# Patient Record
Sex: Male | Born: 1965 | Race: White | Hispanic: No | Marital: Single
Health system: Southern US, Community
[De-identification: ages and names within clinical notes are randomized; demographics above are authoritative.]

---

## 2005-03-15 ENCOUNTER — Emergency Department (HOSPITAL_COMMUNITY): Admission: EM | Admit: 2005-03-15 | Discharge: 2005-03-15 | Payer: Self-pay | Admitting: Family Medicine

## 2005-05-06 ENCOUNTER — Emergency Department (HOSPITAL_COMMUNITY): Admission: EM | Admit: 2005-05-06 | Discharge: 2005-05-06 | Payer: Self-pay | Admitting: Family Medicine

## 2005-05-27 ENCOUNTER — Encounter: Admission: RE | Admit: 2005-05-27 | Discharge: 2005-06-21 | Payer: Self-pay | Admitting: Orthopedic Surgery

## 2010-05-22 ENCOUNTER — Emergency Department (HOSPITAL_COMMUNITY): Admission: EM | Admit: 2010-05-22 | Discharge: 2010-05-22 | Payer: Self-pay | Admitting: Emergency Medicine

## 2012-01-27 IMAGING — CR DG FOOT COMPLETE 3+V*R*
3 series · 3 of 3 positions shown · non-contrast
Comparison: Bruising and swelling.  Right aspect of the first
metatarsal.

CLINICAL DATA: Right foot injury

RIGHT FOOT COMPLETE - 3+ VIEW

[t foot ap right]
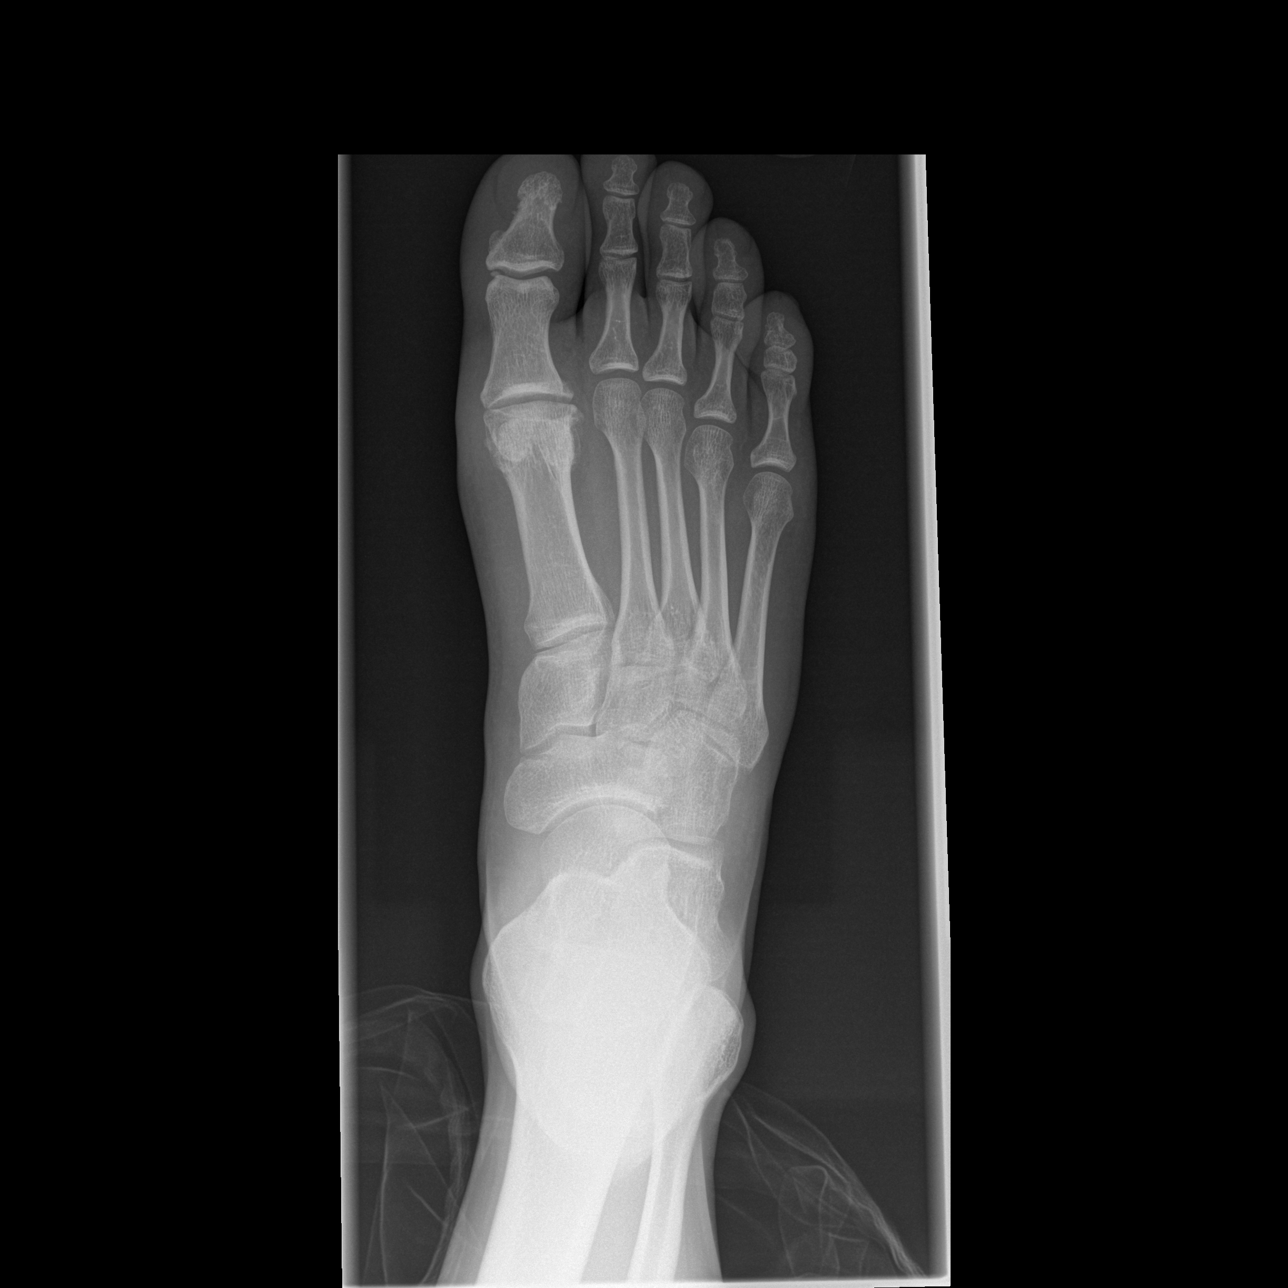

[t foot oblique right]
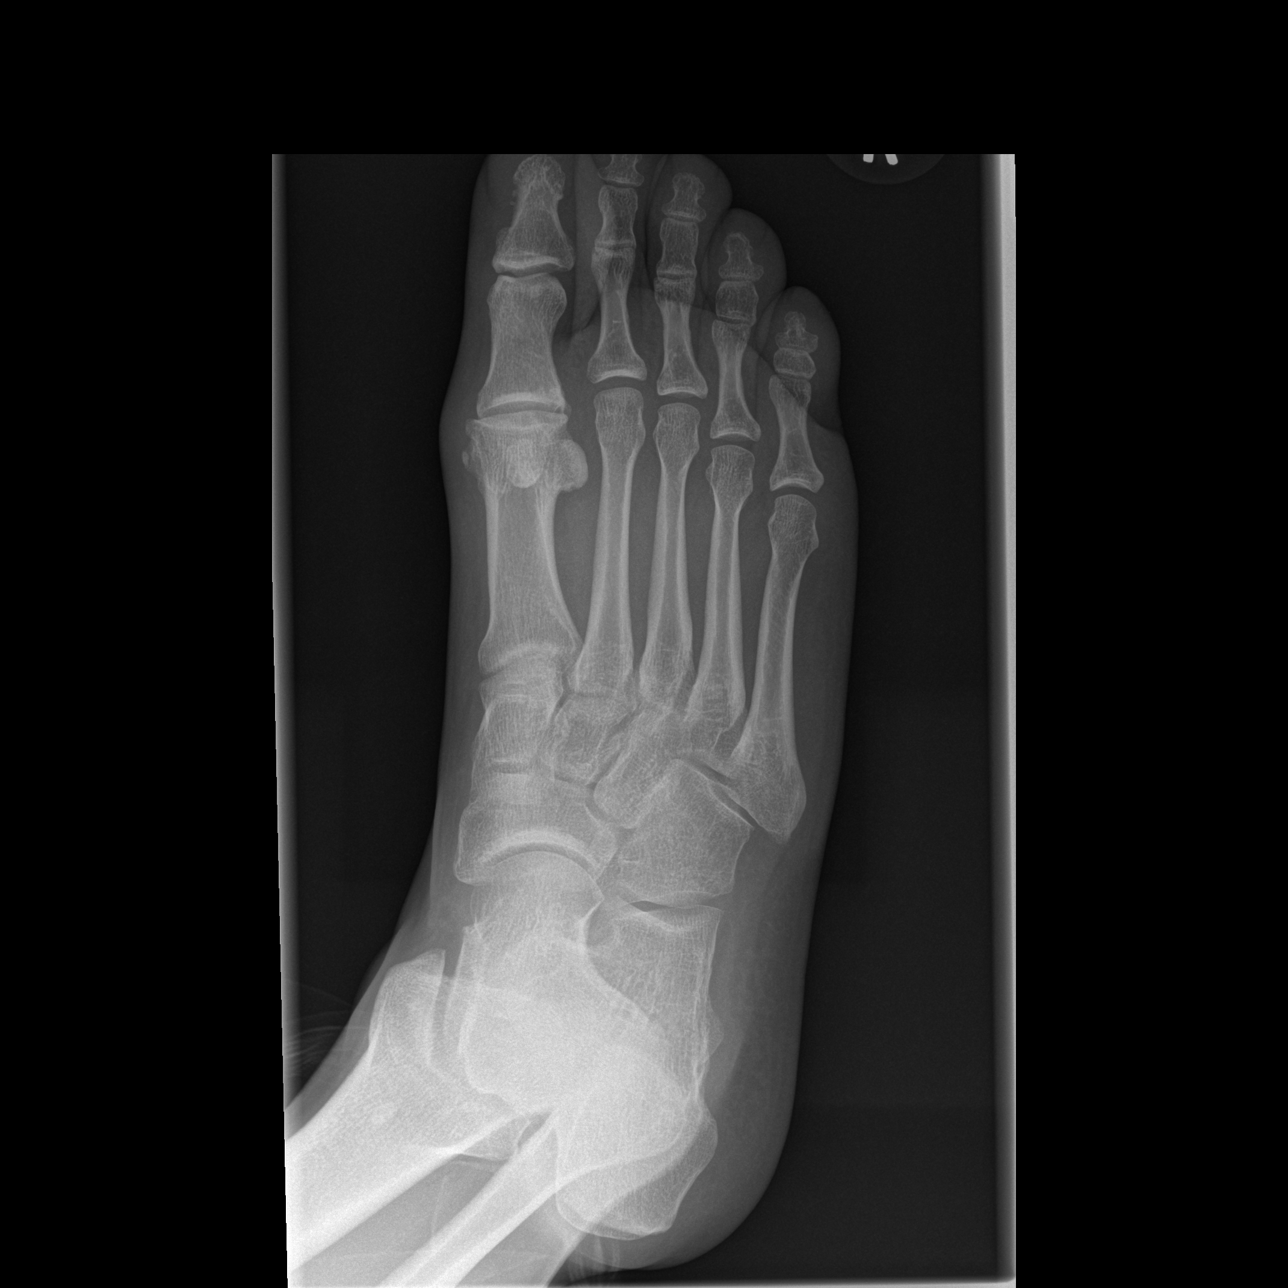

[t foot lat right]
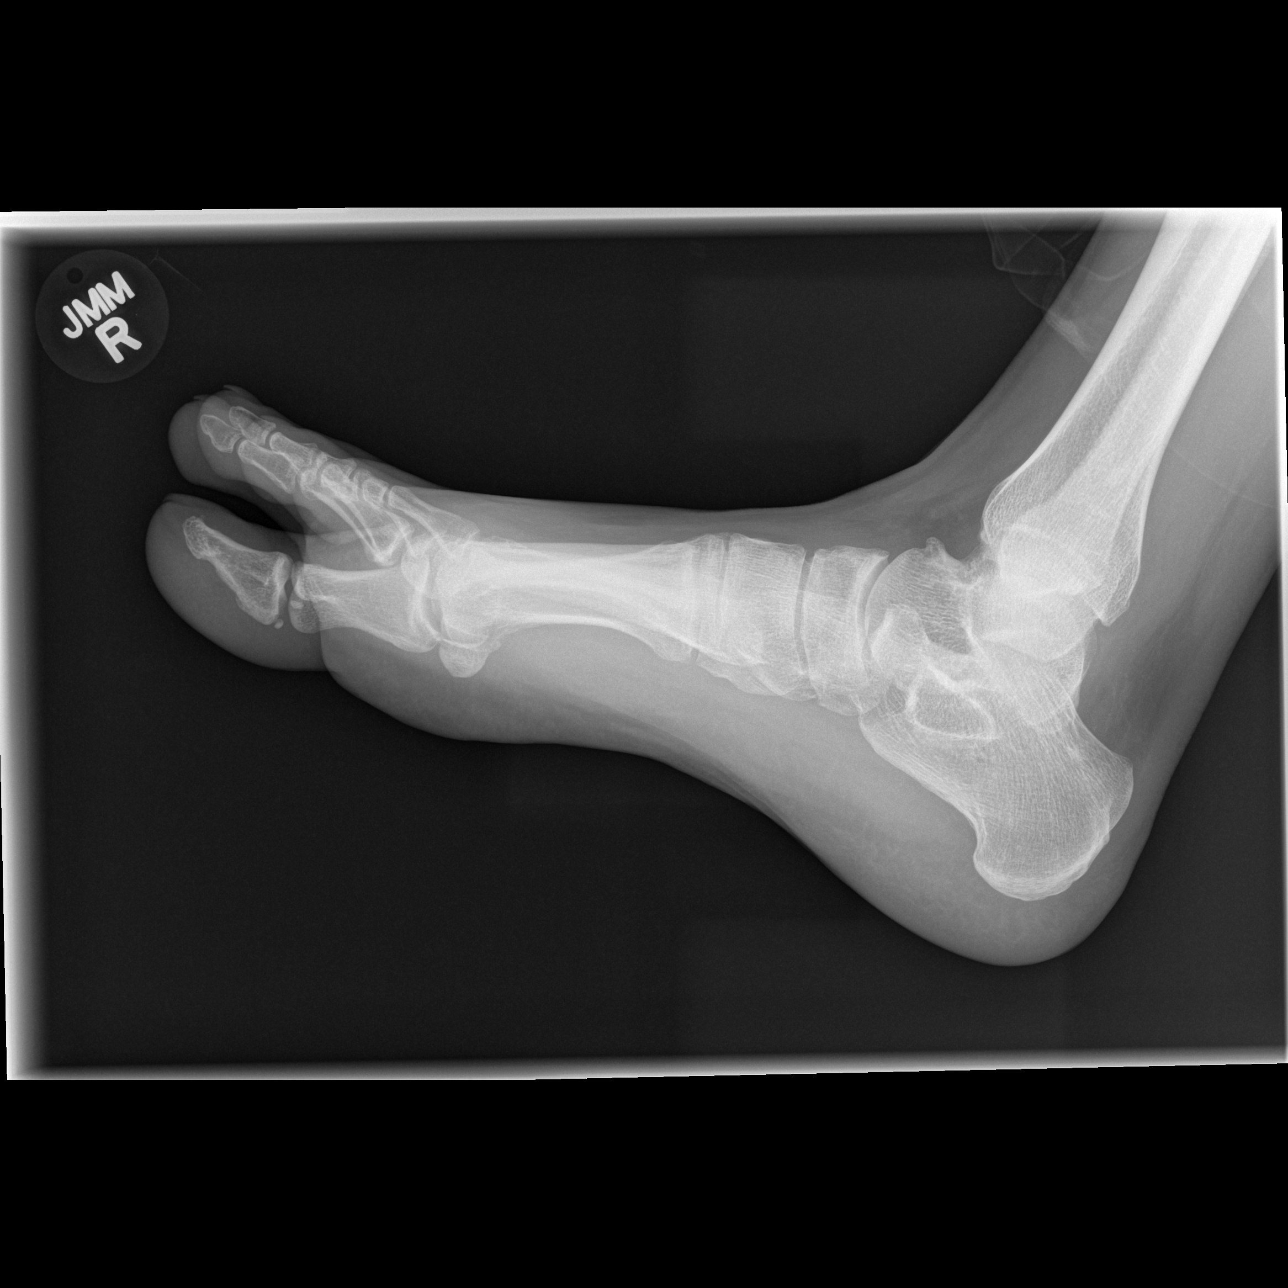

[3 of 3 positions shown; findings below may reference images not displayed]

FINDINGS: No clear evidence of fracture first metatarsal.  There is
significant bony shadows from the sesamoid bone in which could
obscure a nondisplaced fracture.  No additional evidence of
fracture dislocation of the foot.
IMPRESSION: No clear evidence of fracture the first metatarsal although the of
the head metatarsal is obscured by multiple sesamoid bones.  This
could mask underlying nondisplaced fracture.

## 2019-04-04 ENCOUNTER — Other Ambulatory Visit: Payer: Self-pay

## 2019-04-04 DIAGNOSIS — Z20822 Contact with and (suspected) exposure to covid-19: Secondary | ICD-10-CM

## 2019-04-06 LAB — NOVEL CORONAVIRUS, NAA: SARS-CoV-2, NAA: NOT DETECTED

## 2022-01-31 ENCOUNTER — Other Ambulatory Visit (HOSPITAL_COMMUNITY)
Admission: EM | Admit: 2022-01-31 | Discharge: 2022-02-04 | Disposition: A | Discharge status: Home / Self Care | Payer: Commercial Managed Care - PPO | Attending: Psychiatry | Admitting: Psychiatry

## 2022-01-31 DIAGNOSIS — F17210 Nicotine dependence, cigarettes, uncomplicated: Secondary | ICD-10-CM | POA: Insufficient documentation

## 2022-01-31 DIAGNOSIS — F411 Generalized anxiety disorder: Secondary | ICD-10-CM | POA: Insufficient documentation

## 2022-01-31 DIAGNOSIS — Z951 Presence of aortocoronary bypass graft: Secondary | ICD-10-CM | POA: Diagnosis not present

## 2022-01-31 DIAGNOSIS — I10 Essential (primary) hypertension: Secondary | ICD-10-CM | POA: Insufficient documentation

## 2022-01-31 DIAGNOSIS — F1010 Alcohol abuse, uncomplicated: Secondary | ICD-10-CM | POA: Diagnosis present

## 2022-01-31 DIAGNOSIS — F3289 Other specified depressive episodes: Secondary | ICD-10-CM

## 2022-01-31 DIAGNOSIS — Z79899 Other long term (current) drug therapy: Secondary | ICD-10-CM | POA: Diagnosis not present

## 2022-01-31 DIAGNOSIS — F39 Unspecified mood [affective] disorder: Secondary | ICD-10-CM

## 2022-01-31 DIAGNOSIS — Z7982 Long term (current) use of aspirin: Secondary | ICD-10-CM | POA: Insufficient documentation

## 2022-01-31 DIAGNOSIS — I2510 Atherosclerotic heart disease of native coronary artery without angina pectoris: Secondary | ICD-10-CM | POA: Insufficient documentation

## 2022-01-31 DIAGNOSIS — F319 Bipolar disorder, unspecified: Secondary | ICD-10-CM | POA: Diagnosis not present

## 2022-01-31 DIAGNOSIS — Z9151 Personal history of suicidal behavior: Secondary | ICD-10-CM | POA: Diagnosis not present

## 2022-01-31 DIAGNOSIS — Z20822 Contact with and (suspected) exposure to covid-19: Secondary | ICD-10-CM | POA: Diagnosis not present

## 2022-01-31 LAB — CBC WITH DIFFERENTIAL/PLATELET
Abs Immature Granulocytes: 0.02 10*3/uL (ref 0.00–0.07)
Basophils Absolute: 0.1 10*3/uL (ref 0.0–0.1)
Basophils Relative: 1 %
Eosinophils Absolute: 0 10*3/uL (ref 0.0–0.5)
Eosinophils Relative: 1 %
HCT: 44.2 % (ref 39.0–52.0)
Hemoglobin: 14.9 g/dL (ref 13.0–17.0)
Immature Granulocytes: 0 %
Lymphocytes Relative: 47 %
Lymphs Abs: 3.7 10*3/uL (ref 0.7–4.0)
MCH: 30.8 pg (ref 26.0–34.0)
MCHC: 33.7 g/dL (ref 30.0–36.0)
MCV: 91.3 fL (ref 80.0–100.0)
Monocytes Absolute: 0.7 10*3/uL (ref 0.1–1.0)
Monocytes Relative: 9 %
Neutro Abs: 3.3 10*3/uL (ref 1.7–7.7)
Neutrophils Relative %: 42 %
Platelets: 237 10*3/uL (ref 150–400)
RBC: 4.84 MIL/uL (ref 4.22–5.81)
RDW: 12.9 % (ref 11.5–15.5)
WBC: 7.8 10*3/uL (ref 4.0–10.5)
nRBC: 0 % (ref 0.0–0.2)

## 2022-01-31 LAB — LIPID PANEL
Cholesterol: 190 mg/dL (ref 0–200)
HDL: 111 mg/dL (ref >40)
LDL Cholesterol: 49 mg/dL (ref 0–99)
Total CHOL/HDL Ratio: 1.7 RATIO
Triglycerides: 150 mg/dL — ABNORMAL HIGH (ref <150)
VLDL: 30 mg/dL (ref 0–40)

## 2022-01-31 LAB — COMPREHENSIVE METABOLIC PANEL
ALT: 30 U/L (ref 0–44)
AST: 40 U/L (ref 15–41)
Albumin: 4.8 g/dL (ref 3.5–5.0)
Alkaline Phosphatase: 67 U/L (ref 38–126)
Anion gap: 13 (ref 5–15)
BUN: <5 mg/dL — ABNORMAL LOW (ref 6–20)
CO2: 25 mmol/L (ref 22–32)
Calcium: 9.4 mg/dL (ref 8.9–10.3)
Chloride: 99 mmol/L (ref 98–111)
Creatinine, Ser: 0.78 mg/dL (ref 0.61–1.24)
GFR, Estimated: >60 mL/min (ref >60)
Glucose, Bld: 90 mg/dL (ref 70–99)
Potassium: 4.3 mmol/L (ref 3.5–5.1)
Sodium: 137 mmol/L (ref 135–145)
Total Bilirubin: 0.4 mg/dL (ref 0.3–1.2)
Total Protein: 8.1 g/dL (ref 6.5–8.1)

## 2022-01-31 LAB — POCT URINE DRUG SCREEN - MANUAL ENTRY (I-SCREEN)
POC Amphetamine UR: NOT DETECTED
POC Buprenorphine (BUP): NOT DETECTED
POC Cocaine UR: NOT DETECTED
POC Marijuana UR: POSITIVE — AB
POC Methadone UR: NOT DETECTED
POC Methamphetamine UR: NOT DETECTED
POC Morphine: NOT DETECTED
POC Oxazepam (BZO): NOT DETECTED
POC Oxycodone UR: NOT DETECTED
POC Secobarbital (BAR): NOT DETECTED

## 2022-01-31 LAB — RESP PANEL BY RT-PCR (FLU A&B, COVID) ARPGX2
Influenza A by PCR: NEGATIVE
Influenza B by PCR: NEGATIVE
SARS Coronavirus 2 by RT PCR: NEGATIVE

## 2022-01-31 LAB — MAGNESIUM: Magnesium: 2.4 mg/dL (ref 1.7–2.4)

## 2022-01-31 LAB — TSH: TSH: 1.92 u[IU]/mL (ref 0.350–4.500)

## 2022-01-31 LAB — ETHANOL: Alcohol, Ethyl (B): 243 mg/dL — ABNORMAL HIGH (ref <10)

## 2022-01-31 LAB — POC SARS CORONAVIRUS 2 AG: SARSCOV2ONAVIRUS 2 AG: NEGATIVE

## 2022-01-31 MED ORDER — LORAZEPAM 1 MG PO TABS
1.0000 mg | ORAL_TABLET | Freq: Three times a day (TID) | ORAL | Status: AC
  Administered 2022-02-01 – 2022-02-02 (×3): 1 mg via ORAL
  Filled 2022-01-31 (×3): qty 1

## 2022-01-31 MED ORDER — ADULT MULTIVITAMIN W/MINERALS CH
1.0000 | ORAL_TABLET | Freq: Every day | ORAL | Status: DC
  Administered 2022-01-31 – 2022-02-04 (×5): 1 via ORAL
  Filled 2022-01-31 (×5): qty 1

## 2022-01-31 MED ORDER — NAPROXEN 500 MG PO TABS: 500.0000 mg | ORAL_TABLET | Freq: Two times a day (BID) | ORAL | Status: DC | PRN

## 2022-01-31 MED ORDER — CARVEDILOL 3.125 MG PO TABS
6.2500 mg | ORAL_TABLET | Freq: Two times a day (BID) | ORAL | Status: DC
  Administered 2022-01-31 – 2022-02-04 (×8): 6.25 mg via ORAL
  Filled 2022-01-31 (×8): qty 2

## 2022-01-31 MED ORDER — LOPERAMIDE HCL 2 MG PO CAPS: 2.0000 mg | ORAL_CAPSULE | ORAL | Status: AC | PRN

## 2022-01-31 MED ORDER — ONDANSETRON 4 MG PO TBDP: 4.0000 mg | ORAL_TABLET | Freq: Four times a day (QID) | ORAL | Status: AC | PRN

## 2022-01-31 MED ORDER — LORAZEPAM 1 MG PO TABS
1.0000 mg | ORAL_TABLET | Freq: Four times a day (QID) | ORAL | Status: AC | PRN
  Administered 2022-02-02: 1 mg via ORAL

## 2022-01-31 MED ORDER — DICYCLOMINE HCL 20 MG PO TABS: 20.0000 mg | ORAL_TABLET | Freq: Four times a day (QID) | ORAL | Status: DC | PRN

## 2022-01-31 MED ORDER — THIAMINE HCL 100 MG PO TABS
100.0000 mg | ORAL_TABLET | Freq: Every day | ORAL | Status: DC
  Administered 2022-02-01 – 2022-02-04 (×4): 100 mg via ORAL
  Filled 2022-01-31 (×4): qty 1

## 2022-01-31 MED ORDER — LORAZEPAM 1 MG PO TABS
1.0000 mg | ORAL_TABLET | Freq: Every day | ORAL | Status: AC
  Administered 2022-02-04: 1 mg via ORAL
  Filled 2022-01-31: qty 1

## 2022-01-31 MED ORDER — LORAZEPAM 1 MG PO TABS
1.0000 mg | ORAL_TABLET | Freq: Two times a day (BID) | ORAL | Status: AC
  Administered 2022-02-03: 1 mg via ORAL
  Filled 2022-01-31 (×2): qty 1

## 2022-01-31 MED ORDER — HYDROXYZINE HCL 25 MG PO TABS
25.0000 mg | ORAL_TABLET | Freq: Four times a day (QID) | ORAL | Status: AC | PRN
  Administered 2022-02-01 – 2022-02-03 (×4): 25 mg via ORAL
  Filled 2022-01-31: qty 1
  Filled 2022-01-31: qty 15
  Filled 2022-01-31 (×3): qty 1
  Filled 2022-01-31: qty 15

## 2022-01-31 MED ORDER — LISINOPRIL 10 MG PO TABS
10.0000 mg | ORAL_TABLET | Freq: Once | ORAL | Status: AC
  Administered 2022-02-01: 10 mg via ORAL
  Filled 2022-01-31: qty 1

## 2022-01-31 MED ORDER — METHOCARBAMOL 500 MG PO TABS: 500.0000 mg | ORAL_TABLET | Freq: Three times a day (TID) | ORAL | Status: DC | PRN

## 2022-01-31 MED ORDER — THIAMINE HCL 100 MG/ML IJ SOLN
100.0000 mg | Freq: Once | INTRAMUSCULAR | Status: AC
  Administered 2022-01-31: 100 mg via INTRAMUSCULAR
  Filled 2022-01-31: qty 2

## 2022-01-31 MED ORDER — MAGNESIUM HYDROXIDE 400 MG/5ML PO SUSP: 30.0000 mL | Freq: Every day | ORAL | Status: DC | PRN

## 2022-01-31 MED ORDER — LORAZEPAM 1 MG PO TABS
1.0000 mg | ORAL_TABLET | Freq: Four times a day (QID) | ORAL | Status: AC
  Administered 2022-01-31 – 2022-02-01 (×4): 1 mg via ORAL
  Filled 2022-01-31 (×4): qty 1

## 2022-01-31 MED ORDER — TRAZODONE HCL 50 MG PO TABS
50.0000 mg | ORAL_TABLET | Freq: Every evening | ORAL | Status: DC | PRN
  Administered 2022-01-31 – 2022-02-03 (×4): 50 mg via ORAL
  Filled 2022-01-31 (×4): qty 1

## 2022-01-31 MED ORDER — ACETAMINOPHEN 325 MG PO TABS: 650.0000 mg | ORAL_TABLET | Freq: Four times a day (QID) | ORAL | Status: DC | PRN

## 2022-01-31 MED ORDER — ALUM & MAG HYDROXIDE-SIMETH 200-200-20 MG/5ML PO SUSP: 30.0000 mL | ORAL | Status: DC | PRN

## 2022-01-31 MED ORDER — ATORVASTATIN CALCIUM 40 MG PO TABS
40.0000 mg | ORAL_TABLET | Freq: Every day | ORAL | Status: DC
  Administered 2022-02-01 – 2022-02-04 (×4): 40 mg via ORAL
  Filled 2022-01-31 (×4): qty 1

## 2022-01-31 NOTE — ED Notes (Addendum)
Pt coming to [facility].
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
## 2022-02-01 ENCOUNTER — Other Ambulatory Visit: Payer: Self-pay

## 2022-02-01 MED ORDER — LORATADINE 10 MG PO TABS: 10.0000 mg | ORAL_TABLET | Freq: Every day | ORAL | Status: DC | PRN

## 2022-02-01 NOTE — Progress Notes (Signed)
Patient slept all morning and was experiencing mild etoh withdrawal with a CIWA of 5.  Patient got up and ate lunch with peer whom he knows from the community.  Patient is tolerating ativan taper.  No somatic complaints and denies avh shi or plan at present.  Will monitor and provide safe environment.

## 2022-02-01 NOTE — ED Notes (Signed)
Patient refused group said he wanted to sleep, explained group was important I will give him a booklette on Stress Management with strategies and ways to adopt a healthy lifesyle  along with work sheets with ideas to manage your stress.

## 2022-02-01 NOTE — ED Notes (Signed)
Pt is in the bed sleeping. Respirations are even and unlabored. No acute distress noted. Will continue to monitor for safety. 

## 2022-02-01 NOTE — ED Notes (Signed)
Pt provided lunch.  Currently interacting with others on the unit.  No pain or discomfort noted/ voiced.  Breathing is even and unlabored.

## 2022-02-02 ENCOUNTER — Encounter (HOSPITAL_COMMUNITY): Payer: Self-pay

## 2022-02-02 LAB — HEMOGLOBIN A1C
Hgb A1c MFr Bld: 5.9 % — ABNORMAL HIGH (ref 4.8–5.6)
Mean Plasma Glucose: 123 mg/dL

## 2022-02-02 MED ORDER — NICOTINE 21 MG/24HR TD PT24
21.0000 mg | MEDICATED_PATCH | Freq: Every day | TRANSDERMAL | Status: DC
  Administered 2022-02-02 – 2022-02-03 (×2): 21 mg via TRANSDERMAL
  Filled 2022-02-02 (×2): qty 1

## 2022-02-02 MED ORDER — NICOTINE 21 MG/24HR TD PT24
MEDICATED_PATCH | TRANSDERMAL | Status: AC
  Filled 2022-02-02: qty 1

## 2022-02-02 MED ORDER — DM-GUAIFENESIN ER 30-600 MG PO TB12
1.0000 | ORAL_TABLET | Freq: Two times a day (BID) | ORAL | Status: DC
  Administered 2022-02-02 – 2022-02-04 (×4): 1 via ORAL
  Filled 2022-02-02 (×4): qty 1

## 2022-02-02 NOTE — ED Notes (Signed)
Pt is in the bed sleeping. Respirations are even and unlabored. No acute distress noted. Will continue to monitor for safety. 

## 2022-02-02 NOTE — BH IP Treatment Plan (Signed)
Interdisciplinary Treatment and Diagnostic Plan Update  02/02/2022 Time of Session: 10:30AM  Paul Weaver MRN: [id number]  Diagnosis:  Final diagnoses:  Alcohol abuse  Anxiety state  Other depression     Current Medications:  Current Facility-Administered Medications  Medication Dose Route Frequency Provider Last Rate Last Admin   acetaminophen (TYLENOL) tablet 650 mg  650 mg Oral Q6H PRN Sindy Guadeloupe, NP       alum & mag hydroxide-simeth (MAALOX/MYLANTA) 200-200-20 MG/5ML suspension 30 mL  30 mL Oral Q4H PRN Sindy Guadeloupe, NP       atorvastatin (LIPITOR) tablet 40 mg  40 mg Oral Daily Sindy Guadeloupe, NP   40 mg at 02/02/22 0837   carvedilol (COREG) tablet 6.25 mg  6.25 mg Oral BID WC Sindy Guadeloupe, NP   6.25 mg at 02/02/22 9629   dextromethorphan-guaiFENesin (MUCINEX DM) 30-600 MG per 12 hr tablet 1 tablet  1 tablet Oral BID Estella Husk, MD   1 tablet at 02/02/22 1332   hydrOXYzine (ATARAX) tablet 25 mg  25 mg Oral Q6H PRN Sindy Guadeloupe, NP   25 mg at 02/02/22 1524   loperamide (IMODIUM) capsule 2-4 mg  2-4 mg Oral PRN Sindy Guadeloupe, NP       loratadine (CLARITIN) tablet 10 mg  10 mg Oral Daily PRN Estella Husk, MD       LORazepam (ATIVAN) tablet 1 mg  1 mg Oral Q6H PRN Sindy Guadeloupe, NP       LORazepam (ATIVAN) tablet 1 mg  1 mg Oral BID Sindy Guadeloupe, NP       Followed by   Melene Muller ON 02/04/2022] LORazepam (ATIVAN) tablet 1 mg  1 mg Oral Daily Sindy Guadeloupe, NP       magnesium hydroxide (MILK OF MAGNESIA) suspension 30 mL  30 mL Oral Daily PRN Sindy Guadeloupe, NP       multivitamin with minerals tablet 1 tablet  1 tablet Oral Daily Sindy Guadeloupe, NP   1 tablet at 02/02/22 0837   naproxen (NAPROSYN) tablet 500 mg  500 mg Oral BID PRN Sindy Guadeloupe, NP       nicotine (NICODERM CQ - dosed in mg/24 hours) patch 21 mg  21 mg Transdermal Daily Estella Husk, MD   21 mg at 02/02/22 1018   ondansetron (ZOFRAN-ODT) disintegrating tablet 4 mg  4 mg Oral Q6H PRN  Sindy Guadeloupe, NP       thiamine tablet 100 mg  100 mg Oral Daily Sindy Guadeloupe, NP   100 mg at 02/02/22 0837   traZODone (DESYREL) tablet 50 mg  50 mg Oral QHS PRN Sindy Guadeloupe, NP   50 mg at 02/01/22 2252   No current outpatient medications on file.   PTA Medications: Prior to Admission medications   Not on File    Patient Stressors: Substance abuse    Patient Strengths: Motivation for treatment/growth   Treatment Modalities: Medication Management, Group therapy, Case management,  1 to 1 session with clinician, Psychoeducation, Recreational therapy.   Physician Treatment Plan for Primary and Secondary Diagnosis:  Final diagnoses:  Alcohol abuse  Anxiety state  Other depression   Long Term Goal(s): Improvement in symptoms so as ready for discharge  Short Term Goals: Patient will verbalize feelings in meetings with treatment team members. Patient will attend at least of 50% of the groups daily. Pt will complete the PHQ9 on admission, day 3 and discharge. Patient will participate in completing the [place] Suicide Severity Rating Scale Patient will  score a low risk of violence for 24 hours prior to discharge  Medication Management: Evaluate patient's response, side effects, and tolerance of medication regimen.  Therapeutic Interventions: 1 to 1 sessions, Unit Group sessions and Medication administration.  Evaluation of Outcomes: Progressing  LCSW Treatment Plan for Primary Diagnosis:  Final diagnoses:  Alcohol abuse  Anxiety state  Other depression    Long Term Goal(s): Safe transition to appropriate next level of care at discharge.  Short Term Goals: Facilitate acceptance of mental health diagnosis and concerns through verbal commitment to aftercare plan and appointments at discharge. and Identify minimum of 2 triggers associated with mental health/substance abuse issues with treatment team members.  Therapeutic Interventions: Assess for all discharge needs, 1 to 1  time with Child psychotherapist, Explore available resources and support systems, Assess for adequacy in community support network, Educate family and significant other(s) on suicide prevention, Complete Psychosocial Assessment, Interpersonal group therapy.  Evaluation of Outcomes: Progressing   Progress in Treatment: Attending groups: No. Participating in groups: No. Taking medication as prescribed: Yes. Toleration medication: Yes. Family/Significant other contact made: No, will contact:  no one at this time Patient understands diagnosis: Yes. Discussing patient identified problems/goals with staff: Yes. Medical problems stabilized or resolved: Yes. Denies suicidal/homicidal ideation: Yes. Issues/concerns per patient self-inventory: No. Other: None   New problem(s) identified: None   New Short Term/Long Term Goal(s): Participate in AA meetings; Engage in outpatient substance abuse services   Patient Goals: "I just need to stop drinking completely"     Discharge Plan or Barriers: Rishith plans to return home   Reason for Continuation of Hospitalization: Medication stabilization  Estimated Length of Stay: 3-5 days  Last 3 [place] Suicide Severity Risk Score: Flowsheet Row ED from 01/31/2022 in [facility]  C-SSRS RISK CATEGORY No Risk       Last [facility] 2/9 Scores:    01/31/2022    9:50 PM  Depression screen PHQ 2/9  Decreased Interest 2  Down, Depressed, Hopeless 3  PHQ - 2 Score 5  Altered sleeping 2  Tired, decreased energy 2  Change in appetite 3  Feeling bad or failure about yourself  1  Trouble concentrating 1  Moving slowly or fidgety/restless 1  Suicidal thoughts 1  PHQ-9 Score 16  Difficult doing work/chores Very difficult    Scribe for Treatment Team: Maeola Sarah, LCSW 02/02/2022 3:53 PM

## 2022-02-02 NOTE — Progress Notes (Signed)
Patient ate lunch and watched tv for a short period of time.  He is presently sitting in dayroom.  No complaints or distress.  Denies avh shi or plan.  No withdrawal symptoms and tolerating detox protocol.  Patient is pleasant, organized and logical.  Makes needs known appropriately.  Will monitor and provide a safe supportive environment.

## 2022-02-02 NOTE — ED Notes (Signed)
Patient is calm and cooperative. Will be attending the AA meeting when they arrive. Patient denies SI and AVH. Will continue to monitor for safety.

## 2022-02-02 NOTE — ED Notes (Signed)
Patient asleep in bed without issue or complaint.  No evidence of withdrawal.  Will continue to provide safe supportive environment.

## 2022-02-02 NOTE — ED Notes (Signed)
Pt. Is attending AA. ?

## 2022-02-02 NOTE — ED Provider Notes (Signed)
Behavioral Health Progress Note  Date and Time: 02/02/2022 2:08 PM Name: Paul Weaver MRN:  [id number]  Subjective:   Paul Weaver is a 56 year old male with a reported psychiatric history of depression, bipolar disorder, alcohol use disorder, alcohol use disorder, HTN, CAD with h/o CABG who presented to the [facility] behavioral health urgent care on 01/31/2022 seeking detox from alcohol-reported consuming 12 beers a day during the week since mid 2021 and consuming up to 24 beers on the weekend.  Patient was admitted to the [facility] for detox and crisis stabilization.  EtOH 243; UDS + marijuana.  Patient seen and chart reviewed. Most recent CIWA 0. VSS.interviewed this morning in his room in conjunction with LCSW this morning.  Patient describes his mood as "I feel weird".  When asked for clarification, patient reports that he is feels "weak" and has been experiencing chills, low energy and tremors.  Tremors apparent during interview while drinking water. He denies nausea, vomiting, GI upset, headaches.  Patient states that he was able to eat breakfast this morning without issue.  Patient reports sleep and describes trazodone possibly causing vivid dreams.  Discussed with patient that this is a reported SE with this medication-verbalized understanding.  Patient reports that he is experiencing nicotine cravings and requests a nicotine patch.  Patient denies SI/HI/AVH.  Patient reports that he is still interested in detox only and plans to discharge to self-care once detox is complete.  Patient remains agreeable to remain in the [facility] until detox is completed on Friday, 02/04/2022. Patient was given the opportunity to ask questions and  All questions answered. Patient verbalized understanding regarding plan of care.      Diagnosis:  Final diagnoses:  Alcohol abuse  Anxiety state  Other depression    Total Time spent with patient: 20 minutes  Past Psychiatric History: depression, AUD,bipolar  dosrder Past Medical History: No past medical history on file.  Family History: No family history on file. Family Psychiatric  History: father with alcohol use Social History:  Social History   Substance and Sexual Activity  Alcohol Use Not on file     Social History   Substance and Sexual Activity  Drug Use Not on file    Social History   Socioeconomic History   Marital status: Single    Spouse name: Not on file   Number of children: Not on file   Years of education: Not on file   Highest education level: Not on file  Occupational History   Not on file  Tobacco Use   Smoking status: Not on file   Smokeless tobacco: Not on file  Substance and Sexual Activity   Alcohol use: Not on file   Drug use: Not on file   Sexual activity: Not on file  Other Topics Concern   Not on file  Social History Narrative   Not on file   Social Determinants of Health   Financial Resource Strain: Not on file  Food Insecurity: Not on file  Transportation Needs: Not on file  Physical Activity: Not on file  Stress: Not on file  Social Connections: Not on file   SDOH:  SDOH Screenings   Alcohol Screen: Not on file  Depression (PHQ2-9): Medium Risk (01/31/2022)   Depression (PHQ2-9)    PHQ-2 Score: 16  Financial Resource Strain: Not on file  Food Insecurity: Not on file  Housing: Not on file  Physical Activity: Not on file  Social Connections: Not on file  Stress: Not on  file  Tobacco Use: Not on file  Transportation Needs: Not on file   Additional Social History:    Pain Medications: See MAR Prescriptions: See MAR Over the Counter: See MAR History of alcohol / drug use?: Yes Longest period of sobriety (when/how long): 3 years from 2020 through 1/2 of 2021 (per pt) Negative Consequences of Use: Personal relationships Withdrawal Symptoms: Blackouts, Change in blood pressure, Agitation Name of Substance 1: EtOH 1 - Age of First Use: Unknown 1 - Amount (size/oz): 7 12-ounce  beers 1 - Frequency: Daily 1 - Duration: Ongoing 1 - Last Use / Amount: 01/31/22 1 - Method of Aquiring: Purchase 1- Route of Use: Oral Name of Substance 2: Marijuana 2 - Age of First Use: Unknown 2 - Amount (size/oz): 1 joint 2 - Frequency: Used daily for a week 2 - Duration: Ongoing 2 - Last Use / Amount: Today; several hits off of the joint. 2 - Method of Aquiring: Unknown 2 - Route of Substance Use: Smoke                Sleep:  recently poor but states that he slept well last night  Appetite:   decreased; attributes to nausea  Current Medications:  Current Facility-Administered Medications  Medication Dose Route Frequency Provider Last Rate Last Admin   acetaminophen (TYLENOL) tablet 650 mg  650 mg Oral Q6H PRN Sindy Guadeloupe, NP       alum & mag hydroxide-simeth (MAALOX/MYLANTA) 200-200-20 MG/5ML suspension 30 mL  30 mL Oral Q4H PRN Sindy Guadeloupe, NP       atorvastatin (LIPITOR) tablet 40 mg  40 mg Oral Daily Sindy Guadeloupe, NP   40 mg at 02/02/22 0837   carvedilol (COREG) tablet 6.25 mg  6.25 mg Oral BID WC Sindy Guadeloupe, NP   6.25 mg at 02/02/22 1610   dextromethorphan-guaiFENesin (MUCINEX DM) 30-600 MG per 12 hr tablet 1 tablet  1 tablet Oral BID Estella Husk, MD   1 tablet at 02/02/22 1332   dicyclomine (BENTYL) tablet 20 mg  20 mg Oral Q6H PRN Sindy Guadeloupe, NP       hydrOXYzine (ATARAX) tablet 25 mg  25 mg Oral Q6H PRN Sindy Guadeloupe, NP   25 mg at 02/01/22 2252   loperamide (IMODIUM) capsule 2-4 mg  2-4 mg Oral PRN Sindy Guadeloupe, NP       loratadine (CLARITIN) tablet 10 mg  10 mg Oral Daily PRN Estella Husk, MD       LORazepam (ATIVAN) tablet 1 mg  1 mg Oral Q6H PRN Sindy Guadeloupe, NP       LORazepam (ATIVAN) tablet 1 mg  1 mg Oral TID Sindy Guadeloupe, NP   1 mg at 02/02/22 9604   Followed by   LORazepam (ATIVAN) tablet 1 mg  1 mg Oral BID Sindy Guadeloupe, NP       Followed by   Melene Muller ON 02/04/2022] LORazepam (ATIVAN) tablet 1 mg  1 mg Oral Daily  Sindy Guadeloupe, NP       magnesium hydroxide (MILK OF MAGNESIA) suspension 30 mL  30 mL Oral Daily PRN Sindy Guadeloupe, NP       methocarbamol (ROBAXIN) tablet 500 mg  500 mg Oral Q8H PRN Sindy Guadeloupe, NP       multivitamin with minerals tablet 1 tablet  1 tablet Oral Daily Sindy Guadeloupe, NP   1 tablet at 02/02/22 0837   naproxen (NAPROSYN) tablet 500 mg  500 mg Oral BID PRN Sindy Guadeloupe, NP  nicotine (NICODERM CQ - dosed in mg/24 hours) patch 21 mg  21 mg Transdermal Daily Estella HuskLaubach, Yashas Camilli S, MD   21 mg at 02/02/22 1018   ondansetron (ZOFRAN-ODT) disintegrating tablet 4 mg  4 mg Oral Q6H PRN Sindy GuadeloupeWilliams, Roy, NP       thiamine tablet 100 mg  100 mg Oral Daily Sindy GuadeloupeWilliams, Roy, NP   100 mg at 02/02/22 0837   traZODone (DESYREL) tablet 50 mg  50 mg Oral QHS PRN Sindy GuadeloupeWilliams, Roy, NP   50 mg at 02/01/22 2252   No current outpatient medications on file.    Labs  Lab Results:  Admission on 01/31/2022  Component Date Value Ref Range Status   SARS Coronavirus 2 by RT PCR 01/31/2022 NEGATIVE  NEGATIVE Final   Comment: (NOTE) SARS-CoV-2 target nucleic acids are NOT DETECTED.  The SARS-CoV-2 RNA is generally detectable in upper respiratory specimens during the acute phase of infection. The lowest concentration of SARS-CoV-2 viral copies this assay can detect is 138 copies/mL. A negative result does not preclude SARS-Cov-2 infection and should not be used as the sole basis for treatment or other patient management decisions. A negative result may occur with  improper specimen collection/handling, submission of specimen other than nasopharyngeal swab, presence of viral mutation(s) within the areas targeted by this assay, and inadequate number of viral copies(<138 copies/mL). A negative result must be combined with clinical observations, patient history, and epidemiological information. The expected result is Negative.  Fact Sheet for Patients:  BloggerCourse.comhttps://www.fda.gov/media/152166/download  Fact  Sheet for Healthcare Providers:  SeriousBroker.ithttps://www.fda.gov/media/152162/download  This test is no                          t yet approved or cleared by the [place] FDA and  has been authorized for detection and/or diagnosis of SARS-CoV-2 by FDA under an Emergency Use Authorization (EUA). This EUA will remain  in effect (meaning this test can be used) for the duration of the COVID-19 declaration under Section 564(b)(1) of the Act, 21 U.S.C.section 360bbb-3(b)(1), unless the authorization is terminated  or revoked sooner.       Influenza A by PCR 01/31/2022 NEGATIVE  NEGATIVE Final   Influenza B by PCR 01/31/2022 NEGATIVE  NEGATIVE Final   Comment: (NOTE) The Xpert Xpress SARS-CoV-2/FLU/RSV plus assay is intended as an aid in the diagnosis of influenza from Nasopharyngeal swab specimens and should not be used as a sole basis for treatment. Nasal washings and aspirates are unacceptable for Xpert Xpress SARS-CoV-2/FLU/RSV testing.  Fact Sheet for Patients: BloggerCourse.comhttps://www.fda.gov/media/152166/download  Fact Sheet for Healthcare Providers: SeriousBroker.ithttps://www.fda.gov/media/152162/download  This test is not yet approved or cleared by the [place] FDA and has been authorized for detection and/or diagnosis of SARS-CoV-2 by FDA under an Emergency Use Authorization (EUA). This EUA will remain in effect (meaning this test can be used) for the duration of the COVID-19 declaration under Section 564(b)(1) of the Act, 21 U.S.C. section 360bbb-3(b)(1), unless the authorization is terminated or revoked.  Performed at [facility], [address] [place], [place] [id number]    WBC 01/31/2022 7.8  4.0 - 10.5 K/uL Final   RBC 01/31/2022 4.84  4.22 - 5.81 MIL/uL Final   Hemoglobin 01/31/2022 14.9  13.0 - 17.0 g/dL Final   HCT 44/03/474206/26/2023 44.2  39.0 - 52.0 % Final   MCV 01/31/2022 91.3  80.0 - 100.0 fL Final   MCH 01/31/2022 30.8  26.0 - 34.0 pg Final  MCHC 01/31/2022 33.7  30.0 - 36.0 g/dL  Final   RDW 72/53/6644 12.9  11.5 - 15.5 % Final   Platelets 01/31/2022 237  150 - 400 K/uL Final   nRBC 01/31/2022 0.0  0.0 - 0.2 % Final   Neutrophils Relative % 01/31/2022 42  % Final   Neutro Abs 01/31/2022 3.3  1.7 - 7.7 K/uL Final   Lymphocytes Relative 01/31/2022 47  % Final   Lymphs Abs 01/31/2022 3.7  0.7 - 4.0 K/uL Final   Monocytes Relative 01/31/2022 9  % Final   Monocytes Absolute 01/31/2022 0.7  0.1 - 1.0 K/uL Final   Eosinophils Relative 01/31/2022 1  % Final   Eosinophils Absolute 01/31/2022 0.0  0.0 - 0.5 K/uL Final   Basophils Relative 01/31/2022 1  % Final   Basophils Absolute 01/31/2022 0.1  0.0 - 0.1 K/uL Final   Immature Granulocytes 01/31/2022 0  % Final   Abs Immature Granulocytes 01/31/2022 0.02  0.00 - 0.07 K/uL Final   Performed at [facility], [address] [place], [place] [address]   Sodium 01/31/2022 137  135 - 145 mmol/L Final   Potassium 01/31/2022 4.3  3.5 - 5.1 mmol/L Final   Chloride 01/31/2022 99  98 - 111 mmol/L Final   CO2 01/31/2022 25  22 - 32 mmol/L Final   Glucose, Bld 01/31/2022 90  70 - 99 mg/dL Final   Glucose reference range applies only to samples taken after fasting for at least 8 hours.   BUN 01/31/2022 <5 (L)  6 - 20 mg/dL Final   Creatinine, Ser 01/31/2022 0.78  0.61 - 1.24 mg/dL Final   Calcium 25/95/6387 9.4  8.9 - 10.3 mg/dL Final   Total Protein 56/43/3295 8.1  6.5 - 8.1 g/dL Final   Albumin 18/84/1660 4.8  3.5 - 5.0 g/dL Final   AST 63/08/6008 40  15 - 41 U/L Final   ALT 01/31/2022 30  0 - 44 U/L Final   Alkaline Phosphatase 01/31/2022 67  38 - 126 U/L Final   Total Bilirubin 01/31/2022 0.4  0.3 - 1.2 mg/dL Final   GFR, Estimated 01/31/2022 >60  >60 mL/min Final   Comment: (NOTE) Calculated using the CKD-EPI Creatinine Equation (2021)    Anion gap 01/31/2022 13  5 - 15 Final   Performed at [facility], [address] [place], [place] [address]   Hgb A1c MFr Bld 01/31/2022 5.9 (H)  4.8 - 5.6 % Final    Comment: (NOTE)         Prediabetes: 5.7 - 6.4         Diabetes: >6.4         Glycemic control for adults with diabetes: <7.0    Mean Plasma Glucose 01/31/2022 123  mg/dL Final   Comment: (NOTE) Performed At: [facility] [address] [place], [place] [id number] Jolene Schimke MD YH:[id number]    Magnesium 01/31/2022 2.4  1.7 - 2.4 mg/dL Final   Performed at [facility], [address], [place], [place] [address]   Alcohol, Ethyl (B) 01/31/2022 243 (H)  <10 mg/dL Final   Comment: (NOTE) Lowest detectable limit for serum alcohol is 10 mg/dL.  For medical purposes only. Performed at [facility], [address] [place], [place] [address]    Cholesterol 01/31/2022 190  0 - 200 mg/dL Final   Triglycerides 10/62/6948 150 (H)  <150 mg/dL Final   HDL 54/62/7035 111  >40 mg/dL Final  Total CHOL/HDL Ratio 01/31/2022 1.7  RATIO Final   VLDL 01/31/2022 30  0 - 40 mg/dL Final   LDL Cholesterol 01/31/2022 49  0 - 99 mg/dL Final   Comment:        Total Cholesterol/HDL:CHD Risk Coronary Heart Disease Risk Table                     Men   Women  1/2 Average Risk   3.4   3.3  Average Risk       5.0   4.4  2 X Average Risk   9.6   7.1  3 X Average Risk  23.4   11.0        Use the calculated Patient Ratio above and the CHD Risk Table to determine the patient's CHD Risk.        ATP III CLASSIFICATION (LDL):  <100     mg/dL   Optimal  119-147  mg/dL   Near or Above                    Optimal  130-159  mg/dL   Borderline  829-562  mg/dL   High  >130     mg/dL   Very High Performed at [facility], [address], [place], [place] [address]    TSH 01/31/2022 1.920  0.350 - 4.500 uIU/mL Final   Comment: Performed by a 3rd Generation assay with a functional sensitivity of <=0.01 uIU/mL. Performed at [facility], [address], [place], [place] [address]    POC Amphetamine UR 01/31/2022 None Detected  NONE DETECTED (Cut Off Level 1000 ng/mL)  Preliminary   POC Secobarbital (BAR) 01/31/2022 None Detected  NONE DETECTED (Cut Off Level 300 ng/mL) Preliminary   POC Buprenorphine (BUP) 01/31/2022 None Detected  NONE DETECTED (Cut Off Level 10 ng/mL) Preliminary   POC Oxazepam (BZO) 01/31/2022 None Detected  NONE DETECTED (Cut Off Level 300 ng/mL) Preliminary   POC Cocaine UR 01/31/2022 None Detected  NONE DETECTED (Cut Off Level 300 ng/mL) Preliminary   POC Methamphetamine UR 01/31/2022 None Detected  NONE DETECTED (Cut Off Level 1000 ng/mL) Preliminary   POC Morphine 01/31/2022 None Detected  NONE DETECTED (Cut Off Level 300 ng/mL) Preliminary   POC Methadone UR 01/31/2022 None Detected  NONE DETECTED (Cut Off Level 300 ng/mL) Preliminary   POC Oxycodone UR 01/31/2022 None Detected  NONE DETECTED (Cut Off Level 100 ng/mL) Preliminary   POC Marijuana UR 01/31/2022 Positive (A)  NONE DETECTED (Cut Off Level 50 ng/mL) Preliminary   SARSCOV2ONAVIRUS 2 AG 01/31/2022 NEGATIVE  NEGATIVE Final   Comment: (NOTE) SARS-CoV-2 antigen NOT DETECTED.   Negative results are presumptive.  Negative results do not preclude SARS-CoV-2 infection and should not be used as the sole basis for treatment or other patient management decisions, including infection  control decisions, particularly in the presence of clinical signs and  symptoms consistent with COVID-19, or in those who have been in contact with the virus.  Negative results must be combined with clinical observations, patient history, and epidemiological information. The expected result is Negative.  Fact Sheet for Patients: https://www.jennings-kim.com/  Fact Sheet for Healthcare Providers: https://alexander-rogers.biz/  This test is not yet approved or cleared by the [place] FDA and  has been authorized for detection and/or diagnosis of SARS-CoV-2 by FDA under an Emergency Use Authorization (EUA).  This EUA will remain in effect (meaning this test can be  used) for the duration  of  the COV                          ID-19 declaration under Section 564(b)(1) of the Act, 21 U.S.C. section 360bbb-3(b)(1), unless the authorization is terminated or revoked sooner.      Blood Alcohol level:  Lab Results  Component Value Date   ETH 243 (H) 01/31/2022    Metabolic Disorder Labs: Lab Results  Component Value Date   HGBA1C 5.9 (H) 01/31/2022   MPG 123 01/31/2022   No results found for: "PROLACTIN" Lab Results  Component Value Date   CHOL 190 01/31/2022   TRIG 150 (H) 01/31/2022   HDL 111 01/31/2022   CHOLHDL 1.7 01/31/2022   VLDL 30 01/31/2022   LDLCALC 49 01/31/2022    Therapeutic Lab Levels: No results found for: "LITHIUM" No results found for: "VALPROATE" No results found for: "CBMZ"  Physical Findings   PHQ2-9    Flowsheet Row ED from 01/31/2022 in [facility]  PHQ-2 Total Score 5  PHQ-9 Total Score 16      Flowsheet Row ED [facility]31/2022 in [facility]  C-SSRS RISK CATEGORY No Risk        Musculoskeletal  Strength & Muscle Tone: within normal limits Gait & Station: normal Patient leans: N/A  Psychiatric Specialty Exam  Presentation  General Appearance: Appropriate for Environment; Casual  Eye Contact:Fair  Speech:Clear and Coherent; Normal Rate  Speech Volume:Normal  Handedness:Ambidextrous   Mood and Affect  Mood:-- ("I feel weird")  Affect:Appropriate; Congruent   Thought Process  Thought Processes:Coherent; Goal Directed; Linear  Descriptions of Associations:Intact  Orientation:Full (Time, Place and Person)  Thought Content:WDL; Logical  Diagnosis of Schizophrenia or Schizoaffective disorder in past: No    Hallucinations:Hallucinations: None  Ideas of Reference:None  Suicidal Thoughts:Suicidal Thoughts: No  Homicidal Thoughts:Homicidal Thoughts: No   Sensorium  Memory:Immediate Good; Recent Good; Remote  Good  Judgment:Fair  Insight:Fair   Executive Functions  Concentration:Good  Attention Span:Good  Recall:Good  Fund of Knowledge:Good  Language:Good   Psychomotor Activity  Psychomotor Activity:Psychomotor Activity: Decreased   Assets  Assets:Communication Skills; Desire for Improvement   Sleep  Sleep:Sleep: Poor   No data recorded   Physical Exam  Physical Exam Constitutional:      Appearance: Normal appearance. He is normal weight.  HENT:     Head: Normocephalic and atraumatic.  Eyes:     Extraocular Movements: Extraocular movements intact.  Pulmonary:     Effort: Pulmonary effort is normal.  Neurological:     General: No focal deficit present.     Mental Status: He is alert and oriented to person, place, and time.     Comments: Mild tremor with BUE outstreched  Psychiatric:        Attention and Perception: Attention and perception normal.        Speech: Speech normal.        Behavior: Behavior normal. Behavior is cooperative.        Thought Content: Thought content normal.    Review of Systems  Constitutional:  Positive for chills, diaphoresis and malaise/fatigue. Negative for fever.  HENT:  Negative for hearing loss.   Eyes:  Negative for discharge and redness.  Respiratory:  Negative for cough.   Cardiovascular:  Negative for chest pain and palpitations.  Gastrointestinal:  Negative for abdominal pain, nausea and vomiting.  Musculoskeletal:  Negative for myalgias.  Neurological:  Positive for tremors. Negative  for headaches.  Psychiatric/Behavioral:  Positive for substance abuse. Negative for suicidal ideas. The patient has insomnia.    Blood pressure 113/77, pulse 94, temperature 98.3 F (36.8 C), temperature source Tympanic, resp. rate 18, SpO2 98 %. There is no height or weight on file to calculate BMI.  Treatment Plan Summary: Paul Weaver is a 56 year old male with a reported psychiatric history of depression, bipolar disorder, alcohol  use disorder, HTN, CAD with h/o CABG  who presented to the [facility] behavioral health urgent care on 01/31/2022 seeking detox from alcohol-reported consuming 12 beers a day during the week since mid 2021 and consuming up to 24 beers on the weekend.  Patient was started on ativan taper and  admitted to the [facility] for detox and crisis stabilization.  EtOH 243; UDS + marijuana.  Most recent CIWA 0.  Patient reporting alcohol withdrawal symptoms of tremors,insomnia, fatigue, sweating. VSS stable. Patient requesting nicotine replacement-is agreeable to nicotine patch.  Patient is in agreement to remain in the [facility] while completing taper in order to be monitored medically.  Patient made appropriate for continued admission to the [facility] for detox/crisis stabilization. Anciticipcate dc on Friday 6/30 after completion of taper  AUD, severe -continue CIWA protocol -continue ativan detox- 1 mg QID --> 1 mg TID--> 1 mg BID--> 1 mg daily -multivitamin -thiamine -Alcohol withdrawal PRNs  -zofran 4 mg q 6 hrs nausea  -loperamide 2-4 mg for diarrhea  -hydroxyzine 25 mg q6 hours for anxiety  Nicotine dependence -nicoderm cq patch 21 mg   HTN CAD Continue home medications -aspirin 81 mg daily  -atorvastatin 40 mg daily -lisinopril 10 mg daily -carvedilol 6.25 twice a day -Fish oil 100 mg daily.    Dispo: ongoing. LCSW assisting. Anticipate dc to self care on Friday after completion of taper w/ resources for outpatient substance use treatment  Estella Husk, MD 02/02/2022 2:08 PM

## 2022-02-02 NOTE — Clinical Social Work Psych Note (Signed)
LCSW Update Note  LCSW met with the [facility] for introduction and to begin discussions regarding treatment and potential discharge planning.   Candon shared that he felt "weird" this morning due to dreaming last night. Carey denied having any SI, HI or AVH at this time. He also endorsed having withdrawal symptoms including sweating, and a headache. Buryl reports having a "massive headache".   Per his initial assessment note, "Mariea Clonts expressed interest in participating in a residential treatment program following his detox protocol. Per Norman's request, LCSW referred the patient to [facility] for review. Per [facility] admissions, the patient is showing having [facility], which is exclusionary criteria. Mariea Clonts expressed disappointment, however he reported understanding".  "Pt states he drank 7 12-ounce beers today and that over the weekend he drank 31 12-ounce beers. smokes a small amount of marijuana on a daily basis; he states one joint typically lasts him a week".  Orie shared with LCSW that he wanted to complete his detox and discharge home with outpatient resources. He declined any residential treatment services at this time. Tomer reports he lives alone in [place], [place] and is currently employed.   Giavonni denied having any additional questions or concerns at this time.   LCSW will continue to follow.    Radonna Ricker, MSW, LCSW Clinical Education officer, museum ([place]) [facility]

## 2022-02-03 MED ORDER — HYDROXYZINE HCL 25 MG PO TABS: 25.0000 mg | ORAL_TABLET | Freq: Three times a day (TID) | ORAL | 0 refills | Status: AC | PRN

## 2022-02-03 MED ORDER — ATORVASTATIN CALCIUM 40 MG PO TABS: 40.0000 mg | ORAL_TABLET | Freq: Every day | ORAL | Status: AC

## 2022-02-03 MED ORDER — CARVEDILOL 6.25 MG PO TABS: 6.2500 mg | ORAL_TABLET | Freq: Two times a day (BID) | ORAL | Status: AC

## 2022-02-03 MED ORDER — BUSPIRONE HCL 5 MG PO TABS
5.0000 mg | ORAL_TABLET | Freq: Two times a day (BID) | ORAL | Status: DC
  Administered 2022-02-03 – 2022-02-04 (×3): 5 mg via ORAL
  Filled 2022-02-03 (×2): qty 14
  Filled 2022-02-03: qty 1
  Filled 2022-02-03: qty 14
  Filled 2022-02-03 (×2): qty 1

## 2022-02-03 MED ORDER — HYDROXYZINE HCL 25 MG PO TABS
25.0000 mg | ORAL_TABLET | Freq: Three times a day (TID) | ORAL | Status: DC | PRN
  Filled 2022-02-03: qty 15

## 2022-02-03 MED ORDER — NICOTINE 21 MG/24HR TD PT24: 21.0000 mg | MEDICATED_PATCH | Freq: Every day | TRANSDERMAL | Status: AC

## 2022-02-03 MED ORDER — BUSPIRONE HCL 5 MG PO TABS: 5.0000 mg | ORAL_TABLET | Freq: Two times a day (BID) | ORAL | 0 refills | Status: AC

## 2022-02-03 NOTE — ED Notes (Signed)
Patient is resting quietly with eyes closed. No S/S of distress, will continue to monitor for safety. 

## 2022-02-03 NOTE — ED Notes (Signed)
Pt is in the dinning room reading. Respirations are even and unlabored. No acute distress noted. Will continue to monitor for safety.

## 2022-02-03 NOTE — ED Notes (Signed)
Patient participating well in groups. Patient is cooperative and interacts well with staff.  Respiratory is even and unlabored. No distress noted.  will continue to monitor for safety.  

## 2022-02-03 NOTE — Discharge Instructions (Signed)

## 2022-02-03 NOTE — ED Notes (Signed)
Pt reported he is having difficulty sleeping. Pt requested for trazodone for sleep.

## 2022-02-03 NOTE — ED Notes (Signed)
Pt attended group and was given a handout

## 2022-02-03 NOTE — ED Notes (Signed)
Pt is in the bed awake. Respirations are even and unlabored. No acute distress noted. Will continue to monitor for safety. 

## 2022-02-03 NOTE — ED Notes (Signed)
Pt was given breakfast and lunch 

## 2022-02-03 NOTE — ED Notes (Signed)
Patient resting quietly with eyes closed, no S/S of distress. Respirations even and unlabored, will continue to monitor for safety.

## 2022-02-03 NOTE — Progress Notes (Signed)
Patient is alert and oriented X 4 with complaints of anxiety asking for Xanax this morning. RN informed patient no current order at this time will discuss with provider. Patient did receive ativan during medication administration. Patient does not appear to have withdrawal symptoms,no visible sweating, trembling or vomiting. Patients vitals statics within normal range. Patient denies SI and HI. No visible signs of distress. Nursing staff will continue to monitor.

## 2022-02-03 NOTE — ED Notes (Signed)
Pt was given dinner. 

## 2022-02-03 NOTE — ED Provider Notes (Signed)
Behavioral Health Progress Note  Date and Time: 02/03/2022 12:20 PM Name: Paul Weaver MRN:  [id number]  Subjective:   Ivis Henneman is a 56 year old male with a reported psychiatric history of depression, bipolar disorder, alcohol use disorder, alcohol use disorder, HTN, CAD with h/o CABG who presented to the [facility] behavioral health urgent care on 01/31/2022 seeking detox from alcohol-reported consuming 12 beers a day during the week since mid 2021 and consuming up to 24 beers on the weekend.  Patient was admitted to the [facility] for detox and crisis stabilization and started on ativan taper and CIWA protocol..  EtOH 243; UDS + marijuana.  Patient seen and chart reviewed. Most recent CIWA 3 (score of 3 for anxiety). VSS.  Prior to interview, staff informed me of patient requesting xanax. Prior to interview this  morning, patient was observed interacting with staff and peers appropriately. On interview, patient is calm, cooperative and pleasant. Patient describes his mood as anxious and requests medication to assist with this-patient specifically requests "a xanax to chill me out". Educated patient on the similar effects of benzodiazepines and alcohol in the body as well as similar addictive and dependence potential. Patient verbalized understanding although requested xanax because "it will keep me from drinking". Discussed with patient  that benzodiazepines assist with alcohol withdrawal but that there is no evidence to show that benzodiazepines help with alcohol cravings--discussed the possibility of starting naltrexone for alcohol cravings. Patient became defensive and stated that he does not crave alcohol and reported that he will attend AA meetings and meet with his sponsor upon discharge. Patient requests that ativan not be tapered off and that it be increased; educated patient on ativan taper for alcohol detox and that there is currently no indication to increase dose or frequency at this  time. Discussed with patient that PRN vistaril is available for anxiety as needed. Discussed option of starting buspar 5 mg BID as a daily medication for anxiety- after discussing r/b/se/ae patient is agreeable to trial. Patient denies nausea, vomiting, headache, tremors. He denies SI/HI/AVH. Patient reported ongoing poor sleep although reports some improvement over the last couple days.  Discussed that ativan taper ends tomorrow--patient verbalized understanding and ststes that his mother will pick him up tomorrow at 1030-11 am; he states that he plans to stay with her for several days after discharge and requests a letter for work.  Patient was given the opportunity to ask questions and  All questions answered. Patient verbalized understanding regarding plan of care.        Diagnosis:  Final diagnoses:  Alcohol abuse  Anxiety state  Other depression    Total Time spent with patient: 20 minutes  Past Psychiatric History: depression, AUD,bipolar disorder Past Medical History: No past medical history on file.  Family History: No family history on file. Family Psychiatric  History: father with alcohol use Social History:  Social History   Substance and Sexual Activity  Alcohol Use Not on file     Social History   Substance and Sexual Activity  Drug Use Not on file    Social History   Socioeconomic History   Marital status: Single    Spouse name: Not on file   Number of children: Not on file   Years of education: Not on file   Highest education level: Not on file  Occupational History   Not on file  Tobacco Use   Smoking status: Not on file   Smokeless tobacco: Not on file  Substance  and Sexual Activity   Alcohol use: Not on file   Drug use: Not on file   Sexual activity: Not on file  Other Topics Concern   Not on file  Social History Narrative   Not on file   Social Determinants of Health   Financial Resource Strain: Not on file  Food Insecurity: Not on file   Transportation Needs: Not on file  Physical Activity: Not on file  Stress: Not on file  Social Connections: Not on file   SDOH:  SDOH Screenings   Alcohol Screen: Not on file  Depression (PHQ2-9): Medium Risk (02/03/2022)   Depression (PHQ2-9)    PHQ-2 Score: 24  Financial Resource Strain: Not on file  Food Insecurity: Not on file  Housing: Not on file  Physical Activity: Not on file  Social Connections: Not on file  Stress: Not on file  Tobacco Use: Not on file  Transportation Needs: Not on file   Additional Social History:    Pain Medications: See MAR Prescriptions: See MAR Over the Counter: See MAR History of alcohol / drug use?: Yes Longest period of sobriety (when/how long): 3 years from 2020 through 1/2 of 2021 (per pt) Negative Consequences of Use: Personal relationships Withdrawal Symptoms: Blackouts, Change in blood pressure, Agitation Name of Substance 1: EtOH 1 - Age of First Use: Unknown 1 - Amount (size/oz): 7 12-ounce beers 1 - Frequency: Daily 1 - Duration: Ongoing 1 - Last Use / Amount: 01/31/22 1 - Method of Aquiring: Purchase 1- Route of Use: Oral Name of Substance 2: Marijuana 2 - Age of First Use: Unknown 2 - Amount (size/oz): 1 joint 2 - Frequency: Used daily for a week 2 - Duration: Ongoing 2 - Last Use / Amount: Today; several hits off of the joint. 2 - Method of Aquiring: Unknown 2 - Route of Substance Use: Smoke                Sleep:  recently poor but states that he slept well last night  Appetite:   decreased; attributes to nausea  Current Medications:  Current Facility-Administered Medications  Medication Dose Route Frequency Provider Last Rate Last Admin   acetaminophen (TYLENOL) tablet 650 mg  650 mg Oral Q6H PRN Sindy Guadeloupe, NP       alum & mag hydroxide-simeth (MAALOX/MYLANTA) 200-200-20 MG/5ML suspension 30 mL  30 mL Oral Q4H PRN Sindy Guadeloupe, NP       atorvastatin (LIPITOR) tablet 40 mg  40 mg Oral Daily Sindy Guadeloupe, NP   40 mg at 02/03/22 0831   busPIRone (BUSPAR) tablet 5 mg  5 mg Oral BID Estella Husk, MD   5 mg at 02/03/22 1145   carvedilol (COREG) tablet 6.25 mg  6.25 mg Oral BID WC Sindy Guadeloupe, NP   6.25 mg at 02/03/22 0831   dextromethorphan-guaiFENesin (MUCINEX DM) 30-600 MG per 12 hr tablet 1 tablet  1 tablet Oral BID Estella Husk, MD   1 tablet at 02/03/22 0831   hydrOXYzine (ATARAX) tablet 25 mg  25 mg Oral Q6H PRN Sindy Guadeloupe, NP   25 mg at 02/03/22 1150   loperamide (IMODIUM) capsule 2-4 mg  2-4 mg Oral PRN Sindy Guadeloupe, NP       loratadine (CLARITIN) tablet 10 mg  10 mg Oral Daily PRN Estella Husk, MD       LORazepam (ATIVAN) tablet 1 mg  1 mg Oral Q6H PRN Sindy Guadeloupe, NP   1 mg at 02/02/22  2112   [START ON 02/04/2022] LORazepam (ATIVAN) tablet 1 mg  1 mg Oral Daily Evette Georges, NP       magnesium hydroxide (MILK OF MAGNESIA) suspension 30 mL  30 mL Oral Daily PRN Evette Georges, NP       multivitamin with minerals tablet 1 tablet  1 tablet Oral Daily Evette Georges, NP   1 tablet at 02/03/22 0831   naproxen (NAPROSYN) tablet 500 mg  500 mg Oral BID PRN Evette Georges, NP       nicotine (NICODERM CQ - dosed in mg/24 hours) patch 21 mg  21 mg Transdermal Daily Ival Bible, MD   21 mg at 02/03/22 0830   nicotine (NICODERM CQ - dosed in mg/24 hours) patch 21 mg  21 mg Transdermal Daily Ival Bible, MD       ondansetron (ZOFRAN-ODT) disintegrating tablet 4 mg  4 mg Oral Q6H PRN Evette Georges, NP       thiamine tablet 100 mg  100 mg Oral Daily Evette Georges, NP   100 mg at 02/03/22 0831   traZODone (DESYREL) tablet 50 mg  50 mg Oral QHS PRN Evette Georges, NP   50 mg at 02/02/22 2113   No current outpatient medications on file.    Labs  Lab Results:  Admission on 01/31/2022  Component Date Value Ref Range Status   SARS Coronavirus 2 by RT PCR 01/31/2022 NEGATIVE  NEGATIVE Final   Comment: (NOTE) SARS-CoV-2 target nucleic acids are NOT  DETECTED.  The SARS-CoV-2 RNA is generally detectable in upper respiratory specimens during the acute phase of infection. The lowest concentration of SARS-CoV-2 viral copies this assay can detect is 138 copies/mL. A negative result does not preclude SARS-Cov-2 infection and should not be used as the sole basis for treatment or other patient management decisions. A negative result may occur with  improper specimen collection/handling, submission of specimen other than nasopharyngeal swab, presence of viral mutation(s) within the areas targeted by this assay, and inadequate number of viral copies(<138 copies/mL). A negative result must be combined with clinical observations, patient history, and epidemiological information. The expected result is Negative.  Fact Sheet for Patients:  EntrepreneurPulse.com.au  Fact Sheet for Healthcare Providers:  IncredibleEmployment.be  This test is no                          t yet approved or cleared by the [place] FDA and  has been authorized for detection and/or diagnosis of SARS-CoV-2 by FDA under an Emergency Use Authorization (EUA). This EUA will remain  in effect (meaning this test can be used) for the duration of the COVID-19 declaration under Section 564(b)(1) of the Act, 21 U.S.C.section 360bbb-3(b)(1), unless the authorization is terminated  or revoked sooner.       Influenza A by PCR 01/31/2022 NEGATIVE  NEGATIVE Final   Influenza B by PCR 01/31/2022 NEGATIVE  NEGATIVE Final   Comment: (NOTE) The Xpert Xpress SARS-CoV-2/FLU/RSV plus assay is intended as an aid in the diagnosis of influenza from Nasopharyngeal swab specimens and should not be used as a sole basis for treatment. Nasal washings and aspirates are unacceptable for Xpert Xpress SARS-CoV-2/FLU/RSV testing.  Fact Sheet for Patients: EntrepreneurPulse.com.au  Fact Sheet for Healthcare  Providers: IncredibleEmployment.be  This test is not yet approved or cleared by the [place] FDA and has been authorized for detection and/or diagnosis of SARS-CoV-2 by FDA under an Emergency Use Authorization (EUA). This  EUA will remain in effect (meaning this test can be used) for the duration of the COVID-19 declaration under Section 564(b)(1) of the Act, 21 U.S.C. section 360bbb-3(b)(1), unless the authorization is terminated or revoked.  Performed at [facility], [place] [address], [place], [place] [address]    WBC 01/31/2022 7.8  4.0 - 10.5 K/uL Final   RBC 01/31/2022 4.84  4.22 - 5.81 MIL/uL Final   Hemoglobin 01/31/2022 14.9  13.0 - 17.0 g/dL Final   HCT 01/31/2022 44.2  39.0 - 52.0 % Final   MCV 01/31/2022 91.3  80.0 - 100.0 fL Final   MCH 01/31/2022 30.8  26.0 - 34.0 pg Final   MCHC 01/31/2022 33.7  30.0 - 36.0 g/dL Final   RDW 01/31/2022 12.9  11.5 - 15.5 % Final   Platelets 01/31/2022 237  150 - 400 K/uL Final   nRBC 01/31/2022 0.0  0.0 - 0.2 % Final   Neutrophils Relative % 01/31/2022 42  % Final   Neutro Abs 01/31/2022 3.3  1.7 - 7.7 K/uL Final   Lymphocytes Relative 01/31/2022 47  % Final   Lymphs Abs 01/31/2022 3.7  0.7 - 4.0 K/uL Final   Monocytes Relative 01/31/2022 9  % Final   Monocytes Absolute 01/31/2022 0.7  0.1 - 1.0 K/uL Final   Eosinophils Relative 01/31/2022 1  % Final   Eosinophils Absolute 01/31/2022 0.0  0.0 - 0.5 K/uL Final   Basophils Relative 01/31/2022 1  % Final   Basophils Absolute 01/31/2022 0.1  0.0 - 0.1 K/uL Final   Immature Granulocytes 01/31/2022 0  % Final   Abs Immature Granulocytes 01/31/2022 0.02  0.00 - 0.07 K/uL Final   Performed at [facility], [place] [address] [place], [place] [address]   Sodium 01/31/2022 137  135 - 145 mmol/L Final   Potassium 01/31/2022 4.3  3.5 - 5.1 mmol/L Final   Chloride 01/31/2022 99  98 - 111 mmol/L Final   CO2 01/31/2022 25  22 - 32 mmol/L Final   Glucose, Bld  01/31/2022 90  70 - 99 mg/dL Final   Glucose reference range applies only to samples taken after fasting for at least 8 hours.   BUN 01/31/2022 <5 (L)  6 - 20 mg/dL Final   Creatinine, Ser 01/31/2022 0.78  0.61 - 1.24 mg/dL Final   Calcium 01/31/2022 9.4  8.9 - 10.3 mg/dL Final   Total Protein 01/31/2022 8.1  6.5 - 8.1 g/dL Final   Albumin 01/31/2022 4.8  3.5 - 5.0 g/dL Final   AST 01/31/2022 40  15 - 41 U/L Final   ALT 01/31/2022 30  0 - 44 U/L Final   Alkaline Phosphatase 01/31/2022 67  38 - 126 U/L Final   Total Bilirubin 01/31/2022 0.4  0.3 - 1.2 mg/dL Final   GFR, Estimated 01/31/2022 >60  >60 mL/min Final   Comment: (NOTE) Calculated using the CKD-EPI Creatinine Equation (2021)    Anion gap 01/31/2022 13  5 - 15 Final   Performed at [place] [address], [place], [place] [address]   Hgb A1c MFr Bld 01/31/2022 5.9 (H)  4.8 - 5.6 % Final   Comment: (NOTE)         Prediabetes: 5.7 - 6.4         Diabetes: >6.4         Glycemic control for adults with diabetes: <7.0    Mean Plasma Glucose 01/31/2022 123  mg/dL Final   Comment: (NOTE)  Performed At: [facility] [address] [place], [place] [id number] Jolene Schimke MD SM:[id number]    Magnesium 01/31/2022 2.4  1.7 - 2.4 mg/dL Final   Performed at [facility], [address] [place], [place] [address]   Alcohol, Ethyl (B) 01/31/2022 243 (H)  <10 mg/dL Final   Comment: (NOTE) Lowest detectable limit for serum alcohol is 10 mg/dL.  For medical purposes only. Performed at [facility], [address] [place], [place] [address]    Cholesterol 01/31/2022 190  0 - 200 mg/dL Final   Triglycerides 97/58/8325 150 (H)  <150 mg/dL Final   HDL 49/82/6415 111  >40 mg/dL Final   Total CHOL/HDL Ratio 01/31/2022 1.7  RATIO Final   VLDL 01/31/2022 30  0 - 40 mg/dL Final   LDL Cholesterol 01/31/2022 49  0 - 99 mg/dL Final   Comment:        Total Cholesterol/HDL:CHD Risk Coronary Heart Disease Risk  Table                     Men   Women  1/2 Average Risk   3.4   3.3  Average Risk       5.0   4.4  2 X Average Risk   9.6   7.1  3 X Average Risk  23.4   11.0        Use the calculated Patient Ratio above and the CHD Risk Table to determine the patient's CHD Risk.        ATP III CLASSIFICATION (LDL):  <100     mg/dL   Optimal  830-940  mg/dL   Near or Above                    Optimal  130-159  mg/dL   Borderline  768-088  mg/dL   High  >110     mg/dL   Very High Performed at [facility], [address], [place], [place] [address]    TSH 01/31/2022 1.920  0.350 - 4.500 uIU/mL Final   Comment: Performed by a 3rd Generation assay with a functional sensitivity of <=0.01 uIU/mL. Performed at [facility], [address], [place], [place] [address]    POC Amphetamine UR 01/31/2022 None Detected  NONE DETECTED (Cut Off Level 1000 ng/mL) Preliminary   POC Secobarbital (BAR) 01/31/2022 None Detected  NONE DETECTED (Cut Off Level 300 ng/mL) Preliminary   POC Buprenorphine (BUP) 01/31/2022 None Detected  NONE DETECTED (Cut Off Level 10 ng/mL) Preliminary   POC Oxazepam (BZO) 01/31/2022 None Detected  NONE DETECTED (Cut Off Level 300 ng/mL) Preliminary   POC Cocaine UR 01/31/2022 None Detected  NONE DETECTED (Cut Off Level 300 ng/mL) Preliminary   POC Methamphetamine UR 01/31/2022 None Detected  NONE DETECTED (Cut Off Level 1000 ng/mL) Preliminary   POC Morphine 01/31/2022 None Detected  NONE DETECTED (Cut Off Level 300 ng/mL) Preliminary   POC Methadone UR 01/31/2022 None Detected  NONE DETECTED (Cut Off Level 300 ng/mL) Preliminary   POC Oxycodone UR 01/31/2022 None Detected  NONE DETECTED (Cut Off Level 100 ng/mL) Preliminary   POC Marijuana UR 01/31/2022 Positive (A)  NONE DETECTED (Cut Off Level 50 ng/mL) Preliminary   SARSCOV2ONAVIRUS 2 AG 01/31/2022 NEGATIVE  NEGATIVE Final   Comment: (NOTE) SARS-CoV-2 antigen NOT DETECTED.   Negative results are presumptive.  Negative  results do not preclude SARS-CoV-2 infection and should not be used as the sole  basis for treatment or other patient management decisions, including infection  control decisions, particularly in the presence of clinical signs and  symptoms consistent with COVID-19, or in those who have been in contact with the virus.  Negative results must be combined with clinical observations, patient history, and epidemiological information. The expected result is Negative.  Fact Sheet for Patients: HandmadeRecipes.com.cy  Fact Sheet for Healthcare Providers: FuneralLife.at  This test is not yet approved or cleared by the [place] FDA and  has been authorized for detection and/or diagnosis of SARS-CoV-2 by FDA under an Emergency Use Authorization (EUA).  This EUA will remain in effect (meaning this test can be used) for the duration of  the COV                          ID-19 declaration under Section 564(b)(1) of the Act, 21 U.S.C. section 360bbb-3(b)(1), unless the authorization is terminated or revoked sooner.      Blood Alcohol level:  Lab Results  Component Value Date   ETH 243 (H) 123XX123    Metabolic Disorder Labs: Lab Results  Component Value Date   HGBA1C 5.9 (H) 01/31/2022   MPG 123 01/31/2022   No results found for: "PROLACTIN" Lab Results  Component Value Date   CHOL 190 01/31/2022   TRIG 150 (H) 01/31/2022   HDL 111 01/31/2022   CHOLHDL 1.7 01/31/2022   VLDL 30 01/31/2022   LDLCALC 49 01/31/2022    Therapeutic Lab Levels: No results found for: "LITHIUM" No results found for: "VALPROATE" No results found for: "CBMZ"  Physical Findings   PHQ2-9    Flowsheet Row ED from 01/31/2022 in [facility]  PHQ-2 Total Score 6  PHQ-9 Total Score 24      [place] ED from 01/31/2022 in [facility] No Risk        Musculoskeletal   Strength & Muscle Tone: within normal limits Gait & Station: normal Patient leans: N/A  Psychiatric Specialty Exam  Presentation  General Appearance: Appropriate for Environment; Casual  Eye Contact:Fair  Speech:Clear and Coherent; Normal Rate  Speech Volume:Normal  Handedness:Ambidextrous   Mood and Affect  Mood:Anxious (dysphroic)  Affect:Appropriate   Thought Process  Thought Processes:Coherent; Goal Directed; Linear  Descriptions of Associations:Intact  Orientation:Full (Time, Place and Person)  Thought Content:WDL; Logical  Diagnosis of Schizophrenia or Schizoaffective disorder in past: No    Hallucinations:Hallucinations: None  Ideas of Reference:None  Suicidal Thoughts:Suicidal Thoughts: No  Homicidal Thoughts:Homicidal Thoughts: No   Sensorium  Memory:Immediate Good; Recent Good; Remote Good  Judgment:Fair  Insight:Shallow   Executive Functions  Concentration:Good  Attention Span:Good  [place] of Knowledge:Good  Language:Good   Psychomotor Activity  Psychomotor Activity:Psychomotor Activity: Normal   Assets  Assets:Communication Skills; Desire for Improvement; Resilience   Sleep  Sleep:Sleep: Poor (improving but overall poor)   No data recorded   Physical Exam  Physical Exam Constitutional:      Appearance: Normal appearance. He is normal weight.  HENT:     Head: Normocephalic and atraumatic.  Eyes:     Extraocular Movements: Extraocular movements intact.  Pulmonary:     Effort: Pulmonary effort is normal.  Neurological:     General: No focal deficit present.     Mental Status: He is alert and oriented to person, place, and time.  Psychiatric:        Attention and Perception: Attention and  perception normal.        Speech: Speech normal.        Behavior: Behavior normal. Behavior is cooperative.        Thought Content: Thought content normal.    Review of Systems  Constitutional:  Negative for chills  and fever.  HENT:  Negative for hearing loss.   Eyes:  Negative for discharge and redness.  Respiratory:  Negative for cough.   Cardiovascular:  Negative for chest pain.  Gastrointestinal:  Negative for abdominal pain.  Musculoskeletal:  Negative for myalgias.  Neurological:  Negative for headaches.  Psychiatric/Behavioral:  Positive for substance abuse. The patient is nervous/anxious and has insomnia.    Blood pressure 120/86, pulse 90, temperature (!) 97 F (36.1 C), temperature source Oral, resp. rate 18, SpO2 100 %. There is no height or weight on file to calculate BMI.  Treatment Plan Summary: Narvell Tollis is a 56 year old male with a reported psychiatric history of depression, bipolar disorder, alcohol use disorder, HTN, CAD with h/o CABG  who presented to the [facility] behavioral health urgent care on 01/31/2022 seeking detox from alcohol-reported consuming 12 beers a day during the week since mid 2021 and consuming up to 24 beers on the weekend.  Patient was started on ativan taper and  admitted to the [facility] for detox and crisis stabilization.  EtOH 243; UDS + marijuana.  Most recent CIWA 3. VSS stable. Patient reporting anxiety and is requesting increase in ativan/addition of xanax. Educated patient on alcohol detox, benzodiazepine use and alcohol cravings. Patient declined to start naltrexone although was agreeable to start buspar 5 mg BID for anxiety and to utilize PRN vistarul.  Patient is in agreement to remain in the [facility] while completing taper in order to be monitored medically.  Patient made appropriate for continued admission to the [facility] for detox/crisis stabilization. Anciticipcate dc on Friday 6/30 after completion of taper  AUD, severe -continue CIWA protocol -continue ativan detox- 1 mg QID --> 1 mg TID--> 1 mg BID--> 1 mg daily -multivitamin -thiamine -Alcohol withdrawal PRNs  -zofran 4 mg q 6 hrs nausea  -loperamide 2-4 mg for diarrhea  -hydroxyzine 25 mg q6 hours  for anxiety  Nicotine dependence -nicoderm cq patch 21 mg  Anxiety -start buspar 5 mg BID  HTN CAD Continue home medications -aspirin 81 mg daily  -atorvastatin 40 mg daily -lisinopril 10 mg daily -carvedilol 6.25 twice a day -Fish oil 100 mg daily.    Dispo: ongoing. LCSW assisting. Anticipate dc to self care on Friday after completion of taper w/ resources for outpatient substance use treatment  Ival Bible, MD 02/03/2022 12:20 PM

## 2022-02-03 NOTE — Progress Notes (Signed)
Patient attending Chaplain meeting out in court yard. No bizarre behaviors or distressed observed staff will continue to monitor.

## 2022-02-03 NOTE — ED Provider Notes (Signed)
FBC/OBS ASAP Discharge Summary  Date and Time: 02/04/2022 10:13 AM  Name: Paul Weaver  MRN:  [id number]   Discharge Diagnoses:  Final diagnoses:  Alcohol abuse  Anxiety state  Other depression  Mood disorder (HCC)    Subjective:  Patient seen and chart reviewed-  most recent CIWA 0; patient has been appropriate with peers and staff on the unit..  Patient denies SI/HI/AVH.  Patient denies all alcohol withdrawal symptoms including but not limited to nausea, vomiting, GI upset, headaches, tremors, diaphoresis.  Patient describes his mood as "better".  Affect is brighter.  Patient states that he plans to stay with his mom for approximately the next week as she is his primary support system.  Patient reports his goal to attend AA meetings and to maintain sobriety upon discharge from the [facility].  Support and encouragement provided.  Discussed with patient other provided with 7-day samples as well as 30-day scripts and that he will need to contact his PCP or establish with a psychiatrist moving forward in order to get refills.  Patient verbalized understanding was in agreement. Patient was given the opportunity to ask questions and  All questions answered. Patient verbalized understanding regarding plan of care.    On my interview, patient is in NAD, alert, oriented, calm, cooperative, and attentive, with normal affect, speech, and behavior. Objectively, there is no evidence of psychosis/ mania (able to converse coherently, linear and goal directed thought, no RIS, no distractibility, not pre-occupied, no FOI, etc) nor depression to the point of suicidality (able to concentrate, affect full and reactive, speech normal r/v/t, no psychomotor retardation/agitation, etc).  Overall, patient appears to be at the point, in the absence of inhibiting or disinhibiting symptoms, where he can successfully move to lesser restrictive setting for care.   Stay Summary:  Paul Weaver is a 56 year old male with a  reported psychiatric history of depression, bipolar disorder, alcohol use disorder, alcohol use disorder, HTN, CAD with h/o CABG who presented to the [facility] behavioral health urgent care on 01/31/2022 seeking detox from alcohol-reported consuming 12 beers a day during the week since mid 2021 and consuming up to 24 beers on the weekend.  Patient was admitted to the [facility] for detox and crisis stabilization and started on ativan taper and CIWA protocol..  EtOH 243; UDS + marijuana. Patient tolerated ativan taper well; CIWA scores ranged from 4 to 0 throughout admission. Scores were between 0-1 the  last 24 hours prior to discharge.. Patient was started on buspar 5 mg BID for anxiety on 6/29 which he tolerated well. Offered naltrexone for alcohol cravings w hich patient declined. Ativan Taper was completed on 02/04/22 and patient was requesting discharge after completion of detox. Patient was provided with 7 day samples of vistaril asnd buspar and 30 day scripts for medications.  Total Time spent with patient: 30 minutes  Past Psychiatric History: depression, AUD,bipolar disorder Past Medical History: HTN, CAD with h/o CABG  Family History: No family history on file. Family Psychiatric History:  father with alcohol use Social History:  Social History   Substance and Sexual Activity  Alcohol Use Not on file     Social History   Substance and Sexual Activity  Drug Use Not on file    Social History   Socioeconomic History   Marital status: Single    Spouse name: Not on file   Number of children: Not on file   Years of education: Not on file   Highest education level: Not  on file  Occupational History   Not on file  Tobacco Use   Smoking status: Not on file   Smokeless tobacco: Not on file  Substance and Sexual Activity   Alcohol use: Not on file   Drug use: Not on file   Sexual activity: Not on file  Other Topics Concern   Not on file  Social History Narrative   Not on file    Social Determinants of Health   Financial Resource Strain: Not on file  Food Insecurity: Not on file  Transportation Needs: Not on file  Physical Activity: Not on file  Stress: Not on file  Social Connections: Not on file   SDOH:  SDOH Screenings   Alcohol Screen: Not on file  Depression (PHQ2-9): Medium Risk (02/03/2022)   Depression (PHQ2-9)    PHQ-2 Score: 24  Financial Resource Strain: Not on file  Food Insecurity: Not on file  Housing: Not on file  Physical Activity: Not on file  Social Connections: Not on file  Stress: Not on file  Tobacco Use: Not on file  Transportation Needs: Not on file    Tobacco Cessation:  A prescription for an FDA-approved tobacco cessation medication was offered at discharge and the patient refused  Current Medications:  Current Facility-Administered Medications  Medication Dose Route Frequency Provider Last Rate Last Admin   acetaminophen (TYLENOL) tablet 650 mg  650 mg Oral Q6H PRN Sindy Guadeloupe, NP       alum & mag hydroxide-simeth (MAALOX/MYLANTA) 200-200-20 MG/5ML suspension 30 mL  30 mL Oral Q4H PRN Sindy Guadeloupe, NP       atorvastatin (LIPITOR) tablet 40 mg  40 mg Oral Daily Sindy Guadeloupe, NP   40 mg at 02/04/22 0813   busPIRone (BUSPAR) tablet 5 mg  5 mg Oral BID Estella Husk, MD   5 mg at 02/04/22 0813   carvedilol (COREG) tablet 6.25 mg  6.25 mg Oral BID WC Sindy Guadeloupe, NP   6.25 mg at 02/04/22 3875   dextromethorphan-guaiFENesin (MUCINEX DM) 30-600 MG per 12 hr tablet 1 tablet  1 tablet Oral BID Estella Husk, MD   1 tablet at 02/04/22 0814   loratadine (CLARITIN) tablet 10 mg  10 mg Oral Daily PRN Estella Husk, MD       magnesium hydroxide (MILK OF MAGNESIA) suspension 30 mL  30 mL Oral Daily PRN Sindy Guadeloupe, NP       multivitamin with minerals tablet 1 tablet  1 tablet Oral Daily Sindy Guadeloupe, NP   1 tablet at 02/04/22 0813   naproxen (NAPROSYN) tablet 500 mg  500 mg Oral BID PRN Sindy Guadeloupe, NP        nicotine (NICODERM CQ - dosed in mg/24 hours) patch 21 mg  21 mg Transdermal Daily Estella Husk, MD   21 mg at 02/03/22 0830   thiamine tablet 100 mg  100 mg Oral Daily Sindy Guadeloupe, NP   100 mg at 02/04/22 6433   traZODone (DESYREL) tablet 50 mg  50 mg Oral QHS PRN Sindy Guadeloupe, NP   50 mg at 02/03/22 2109   Current Outpatient Medications  Medication Sig Dispense Refill   atorvastatin (LIPITOR) 40 MG tablet Take 1 tablet (40 mg total) by mouth daily.     busPIRone (BUSPAR) 5 MG tablet Take 1 tablet (5 mg total) by mouth 2 (two) times daily. 60 tablet 0   carvedilol (COREG) 6.25 MG tablet Take 1 tablet (6.25 mg total) by mouth 2 (two)  times daily with a meal.     hydrOXYzine (ATARAX) 25 MG tablet Take 1 tablet (25 mg total) by mouth every 8 (eight) hours as needed for anxiety. 60 tablet 0    PTA Medications: (Not in a hospital admission)      02/03/2022   11:31 AM 01/31/2022    9:50 PM  Depression screen PHQ 2/9  Decreased Interest 3 2  Down, Depressed, Hopeless 3 3  PHQ - 2 Score 6 5  Altered sleeping 3 2  Tired, decreased energy 3 2  Change in appetite 3 3  Feeling bad or failure about yourself  3 1  Trouble concentrating 3 1  Moving slowly or fidgety/restless 3 1  Suicidal thoughts 0 1  PHQ-9 Score 24 16  Difficult doing work/chores Extremely dIfficult Very difficult    Flowsheet Row ED from [facility]/31/2022 in [facility]  C-SSRS RISK CATEGORY No Risk       Musculoskeletal  Strength & Muscle Tone: within normal limits Gait & Station: normal Patient leans: N/A  Psychiatric Specialty Exam  Presentation  General Appearance: Appropriate for Environment; Casual  Eye Contact:Good  Speech:Clear and Coherent; Normal Rate  Speech Volume:Normal  Handedness:Ambidextrous   Mood and Affect  Mood:-- ("better")  Affect:Appropriate; Congruent   Thought Process  Thought Processes:Coherent; Goal Directed; Linear  Descriptions of  Associations:Intact  Orientation:Full (Time, Place and Person)  Thought Content:WDL; Logical  Diagnosis of Schizophrenia or Schizoaffective disorder in past: No    Hallucinations:Hallucinations: None  Ideas of Reference:None  Suicidal Thoughts:Suicidal Thoughts: No  Homicidal Thoughts:Homicidal Thoughts: No   Sensorium  Memory:Immediate Good; Recent Good; Remote Good  Judgment:Good  Insight:Good   Executive Functions  Concentration:Good  Attention Span:Good  Recall:Good  Fund of Knowledge:Good  Language:Good   Psychomotor Activity  Psychomotor Activity:Psychomotor Activity: Normal   Assets  Assets:Communication Skills; Desire for Improvement; Resilience   Sleep  Sleep:Sleep: Poor   No data recorded  Physical Exam  Physical Exam Constitutional:      Appearance: Normal appearance. He is normal weight.  HENT:     Head: Normocephalic and atraumatic.  Eyes:     Extraocular Movements: Extraocular movements intact.  Cardiovascular:     Heart sounds: Normal heart sounds.  Pulmonary:     Effort: Pulmonary effort is normal.     Breath sounds: Normal breath sounds.  Neurological:     General: No focal deficit present.     Mental Status: He is alert and oriented to person, place, and time.  Psychiatric:        Attention and Perception: Attention and perception normal.        Speech: Speech normal.        Behavior: Behavior normal. Behavior is cooperative.        Thought Content: Thought content normal.    Review of Systems  Constitutional:  Negative for chills and fever.  HENT:  Negative for hearing loss.   Eyes:  Negative for discharge and redness.  Respiratory:  Negative for cough.   Cardiovascular:  Negative for chest pain.  Gastrointestinal:  Negative for abdominal pain.  Musculoskeletal:  Negative for myalgias.  Neurological:  Negative for headaches.  Psychiatric/Behavioral:  Positive for substance abuse. Negative for hallucinations. The  patient has insomnia.    Blood pressure 121/81, pulse 100, temperature 97.9 F (36.6 C), temperature source Oral, resp. rate 18, SpO2 100 %. There is no height or weight on file to calculate BMI.  Demographic Factors:  Male and  Caucasian  Loss Factors: NA  Historical Factors: Family history of mental illness or substance abuse and Impulsivity  Risk Reduction Factors:   Sense of responsibility to family, Employed, Living with another person, especially a relative, and Positive social support  Continued Clinical Symptoms:  Alcohol/Substance Abuse/Dependencies Previous Psychiatric Diagnoses and Treatments Mood disorder  Cognitive Features That Contribute To Risk:  None    Suicide Risk:  Minimal: No identifiable suicidal ideation.  Patients presenting with no risk factors but with morbid ruminations; may be classified as minimal risk based on the severity of the depressive symptoms  Plan Of Care/Follow-up recommendations:  Activity:  as tolerated Diet:  regular Other:     Take all medications as prescribed by his/her mental healthcare provider. Report any adverse effects and or reactions from the medicines to your outpatient provider promptly. Do not engage in alcohol and or illegal drug use while on prescription medicines. In the event of worsening symptoms, call the crisis hotline, 911 and or go to the nearest ED for appropriate evaluation and treatment of symptoms. follow-up with your primary care provider for your other medical issues, concerns and or health care needs.    Allergies as of 02/04/2022   Not on File      Medication List     TAKE these medications    atorvastatin 40 MG tablet Commonly known as: LIPITOR Take 1 tablet (40 mg total) by mouth daily.   busPIRone 5 MG tablet Commonly known as: BUSPAR Take 1 tablet (5 mg total) by mouth 2 (two) times daily.   carvedilol 6.25 MG tablet Commonly known as: COREG Take 1 tablet (6.25 mg total) by mouth 2  (two) times daily with a meal.   hydrOXYzine 25 MG tablet Commonly known as: ATARAX Take 1 tablet (25 mg total) by mouth every 8 (eight) hours as needed for anxiety.         Patient was provided with 7 day samples of buspar and hydroxyzine as well as paper prescriptions for 30 days with 0 refills at time of discharge.  Patient reported having prescriptions for other medications. Patient was provided with follow up information regarding psychiatric outpatient resources in [place] with the assistance of SW prior to discharge.     Disposition: self care  Estella Husk, MD 02/04/2022, 10:13 AM

## 2022-02-03 NOTE — Progress Notes (Signed)
Patient eating lunch in dayroom. No distress observed. Staff will continue to monitor.

## 2022-02-03 NOTE — ED Notes (Signed)
Pt attended AA and group tonight.

## 2022-02-04 NOTE — ED Notes (Signed)
Patient discharged with follow up community resources for outpatient substance abuse treatment. Patient received medication education with sample medications, and paper prescriptions. ALL belongings from [facility] locker returned to patient,and patient signed belongings form consenting all belongings returned. Patient discharged home with mother in private vehicle.

## 2022-02-04 NOTE — Progress Notes (Signed)
RN received report. Patient currently laying in bed with eyes closed, even and unlabored respirations. Nursing staff will continue to monitor.

## 2022-02-04 NOTE — ED Notes (Signed)
Pt is in the bed sleeping. Respirations are even and unlabored. No acute distress noted. Will continue to monitor for safety. 

## 2022-02-04 NOTE — Progress Notes (Signed)
Patient is alert and oriented X 4, denies SI, HI and AVH. Complains of  sleeping difficulties during the night. Patient animated this morning about discharge, appropriate to circumstances, logical and coherent in speech, cooperative and calm, appearance unremarkable, patient has been showering daily on unit. Patient received scheduled medications this morning but declined nicotine patch due to pending discharge. No bizarre behaviors observed. Nursing staff will continue to monitor.

## 2022-02-04 NOTE — ED Notes (Signed)
Pt. Paul Weaver he was still having trouble for sleep. Hes been tossing and turning all night upon my rounds.
